# Patient Record
Sex: Female | Born: 1950
Health system: Southern US, Community
[De-identification: ages and names within clinical notes are randomized; demographics above are authoritative.]

## PROBLEM LIST (undated history)

## (undated) DIAGNOSIS — Z973 Presence of spectacles and contact lenses: Secondary | ICD-10-CM

## (undated) DIAGNOSIS — E785 Hyperlipidemia, unspecified: Secondary | ICD-10-CM

## (undated) DIAGNOSIS — Z780 Asymptomatic menopausal state: Secondary | ICD-10-CM

## (undated) HISTORY — DX: Asymptomatic menopausal state: Z78.0

## (undated) HISTORY — DX: Hyperlipidemia, unspecified: E78.5

---

## 2005-03-11 ENCOUNTER — Ambulatory Visit: Payer: Self-pay | Admitting: Unknown Physician Specialty

## 2005-03-21 ENCOUNTER — Ambulatory Visit: Payer: Self-pay | Admitting: Unknown Physician Specialty

## 2005-06-16 HISTORY — PX: DILATION AND CURETTAGE, DIAGNOSTIC / THERAPEUTIC: SUR384

## 2006-08-25 ENCOUNTER — Ambulatory Visit: Payer: Self-pay | Admitting: Unknown Physician Specialty

## 2007-01-11 ENCOUNTER — Ambulatory Visit: Payer: Self-pay | Admitting: Unknown Physician Specialty

## 2007-01-21 ENCOUNTER — Ambulatory Visit: Payer: Self-pay | Admitting: Unknown Physician Specialty

## 2007-02-17 ENCOUNTER — Ambulatory Visit: Payer: Self-pay | Admitting: Unknown Physician Specialty

## 2007-12-21 ENCOUNTER — Ambulatory Visit: Payer: Self-pay | Admitting: Chiropractic Medicine

## 2007-12-21 ENCOUNTER — Ambulatory Visit: Payer: Self-pay | Admitting: Unknown Physician Specialty

## 2008-06-07 ENCOUNTER — Ambulatory Visit: Payer: Self-pay | Admitting: Unknown Physician Specialty

## 2009-01-22 ENCOUNTER — Ambulatory Visit: Payer: Self-pay | Admitting: Unknown Physician Specialty

## 2010-01-04 LAB — HM PAP SMEAR: HM Pap smear: NORMAL

## 2010-02-11 ENCOUNTER — Ambulatory Visit: Payer: Self-pay | Admitting: Unknown Physician Specialty

## 2011-01-05 LAB — HM MAMMOGRAPHY: HM Mammogram: NORMAL

## 2011-02-18 ENCOUNTER — Ambulatory Visit: Payer: Self-pay | Admitting: Unknown Physician Specialty

## 2011-03-08 LAB — HM MAMMOGRAPHY: HM Mammogram: NORMAL

## 2011-07-08 ENCOUNTER — Encounter: Payer: Self-pay | Admitting: Internal Medicine

## 2011-07-08 ENCOUNTER — Ambulatory Visit (INDEPENDENT_AMBULATORY_CARE_PROVIDER_SITE_OTHER): Payer: Self-pay | Admitting: Internal Medicine

## 2011-07-08 DIAGNOSIS — Z780 Asymptomatic menopausal state: Secondary | ICD-10-CM | POA: Insufficient documentation

## 2011-07-08 DIAGNOSIS — N951 Menopausal and female climacteric states: Secondary | ICD-10-CM

## 2011-07-08 DIAGNOSIS — M899 Disorder of bone, unspecified: Secondary | ICD-10-CM

## 2011-07-08 DIAGNOSIS — M8580 Other specified disorders of bone density and structure, unspecified site: Secondary | ICD-10-CM | POA: Insufficient documentation

## 2011-07-08 DIAGNOSIS — E785 Hyperlipidemia, unspecified: Secondary | ICD-10-CM | POA: Insufficient documentation

## 2011-07-08 DIAGNOSIS — Z79899 Other long term (current) drug therapy: Secondary | ICD-10-CM

## 2011-07-08 LAB — COMPREHENSIVE METABOLIC PANEL
AST: 18 U/L (ref 0–37)
BUN: 22 mg/dL (ref 6–23)
Calcium: 8.9 mg/dL (ref 8.4–10.5)
Chloride: 108 mEq/L (ref 96–112)
Creatinine, Ser: 0.8 mg/dL (ref 0.4–1.2)
GFR: 83.65 mL/min (ref >60.00)
Total Bilirubin: 0.6 mg/dL (ref 0.3–1.2)

## 2011-07-08 LAB — LIPID PANEL
Cholesterol: 153 mg/dL (ref 0–200)
HDL: 62.7 mg/dL (ref >39.00)
LDL Cholesterol: 74 mg/dL (ref 0–99)
Total CHOL/HDL Ratio: 2
Triglycerides: 81 mg/dL (ref 0.0–149.0)

## 2011-07-08 NOTE — Patient Instructions (Signed)
If you want to taper your prempro,  Take one tablet every other day for one week,  Then every 2 days for one week then stop .

## 2011-07-08 NOTE — Assessment & Plan Note (Addendum)
She was treated with alendronate for 5 years for osteopenia, bt it was stopped 2 years ago.  Last DEXA was 16109 at [facility]. I will review her prior DEXA scan and compared to the one that we will obtain this year. If there has been significant deterioration in T-scores without treatment we'll do this year we will discuss alternative treatments other than other than bisphosphonates, including Evista earlier.

## 2011-07-08 NOTE — Progress Notes (Signed)
Subjective:    Patient ID: Ashley Ward, female    DOB: 1950-10-28, 61 y.o.   MRN: [id number]  HPI Ashley Ward is a 61 year old white female with a history of hypertension who is here to transfer care from Dr. Francia Greaves. She feels well today is recovering from a viral infection which has lasted several days but did not involve fevers or myalgias. She has no sore throat or productive cough at this time. She did receive the influenza vaccine back in September. She has been taking cholesterol medication for the last year and he is due for repeat check along with liver function tests. She is fasting today.    Past Medical History  Diagnosis Date  . Menopause   . Hyperlipidemia   . Menopause    No current outpatient prescriptions on file prior to visit.    Review of Systems  Constitutional: Negative for fever, chills and unexpected weight change.  HENT: Positive for congestion, rhinorrhea and postnasal drip. Negative for hearing loss, ear pain, nosebleeds, sore throat, facial swelling, sneezing, mouth sores, trouble swallowing, neck pain, neck stiffness, voice change, sinus pressure, tinnitus and ear discharge.   Eyes: Negative for pain, discharge, redness and visual disturbance.  Respiratory: Negative for cough, chest tightness, shortness of breath, wheezing and stridor.   Cardiovascular: Negative for chest pain, palpitations and leg swelling.  Musculoskeletal: Negative for myalgias and arthralgias.  Skin: Negative for color change and rash.  Neurological: Negative for dizziness, weakness, light-headedness and headaches.  Hematological: Negative for adenopathy.       Objective:   Physical Exam  Constitutional: She is oriented to person, place, and time. She appears well-developed and well-nourished.  HENT:  Mouth/Throat: Oropharynx is clear and moist.  Eyes: EOM are normal. Pupils are equal, round, and reactive to light. No scleral icterus.  Neck: Normal range of motion. Neck  supple. No JVD present. No thyromegaly present.  Cardiovascular: Normal rate, regular rhythm, normal heart sounds and intact distal pulses.   Pulmonary/Chest: Effort normal and breath sounds normal.  Abdominal: Soft. Bowel sounds are normal. She exhibits no mass. There is no tenderness.  Musculoskeletal: Normal range of motion. She exhibits no edema.  Lymphadenopathy:    She has no cervical adenopathy.  Neurological: She is alert and oriented to person, place, and time.  Skin: Skin is warm and dry.  Psychiatric: She has a normal mood and affect.      Assessment & Plan:   Other and unspecified hyperlipidemia She has been taking statin for several years for management of hyperlipidemia. She has not had a fasting lipid panel or liver function tests in 6 months and is therefore due. She has lost 20 pounds using dietary restriction via Weight Watchers and exercise and is hopeful she will be able to stop statin.   Osteopenia She was treated with alendronate for 5 years for osteopenia, bt it was stopped 2 years ago.  Last DEXA was 40981 at [facility]. I will review her prior DEXA scan and compared to the one that we will obtain this year. If there has been significant deterioration in T-scores without treatment we'll do this year we will discuss alternative treatments other than other than bisphosphonates, including Evista earlier.   Menopause She has been taking Prempro low dose daily for several years for management of menopausal symptoms. She is interested in discontinuing this medication if not needed to control her symptoms. Suggested tapering her dose by taking 1 tablet every other day for a week followed  by every other day for 2 weeks. Recommended that she maintain her dose at whatever level controls her hot flashes if they are severe. She will need a Pap smear at next visit    Updated Medication List Outpatient Encounter Prescriptions as of 07/08/2011  Medication Sig Dispense Refill  .  estrogen, conjugated,-medroxyprogesterone (PREMPRO) 0.3-1.5 MG per tablet Take 1 tablet by mouth daily.      . rosuvastatin (CRESTOR) 5 MG tablet Take 5 mg by mouth daily.

## 2011-07-08 NOTE — Assessment & Plan Note (Addendum)
She has been taking statin for several years for management of hyperlipidemia. She has not had a fasting lipid panel or liver function tests in 6 months and is therefore due. She has lost 20 pounds using dietary restriction via Weight Watchers and exercise and is hopeful she will be able to stop statin.

## 2011-07-09 ENCOUNTER — Encounter: Payer: Self-pay | Admitting: Internal Medicine

## 2011-07-09 NOTE — Assessment & Plan Note (Signed)
She has been taking Prempro low dose daily for several years for management of menopausal symptoms. She is interested in discontinuing this medication if not needed to control her symptoms. Suggested tapering her dose by taking 1 tablet every other day for a week followed by every other day for 2 weeks. Recommended that she maintain her dose at whatever level controls her hot flashes if they are severe. She will need a Pap smear at next visit

## 2011-09-05 ENCOUNTER — Other Ambulatory Visit: Payer: Self-pay | Admitting: Internal Medicine

## 2011-09-05 MED ORDER — ROSUVASTATIN CALCIUM 5 MG PO TABS: 5.0000 mg | ORAL_TABLET | Freq: Every day | ORAL | Status: DC

## 2012-01-02 ENCOUNTER — Other Ambulatory Visit: Payer: Self-pay | Admitting: *Deleted

## 2012-01-02 MED ORDER — CONJ ESTROG-MEDROXYPROGEST ACE 0.3-1.5 MG PO TABS: 1.0000 | ORAL_TABLET | Freq: Every day | ORAL | Status: DC

## 2012-01-05 ENCOUNTER — Encounter: Payer: Self-pay | Admitting: Internal Medicine

## 2012-01-05 ENCOUNTER — Ambulatory Visit (INDEPENDENT_AMBULATORY_CARE_PROVIDER_SITE_OTHER): Payer: 59 | Admitting: Internal Medicine

## 2012-01-05 VITALS — BP 110/66 | HR 74 | Temp 98.3°F | Resp 16 | Wt 154.8 lb

## 2012-01-05 DIAGNOSIS — Z6825 Body mass index (BMI) 25.0-25.9, adult: Secondary | ICD-10-CM

## 2012-01-05 DIAGNOSIS — Z780 Asymptomatic menopausal state: Secondary | ICD-10-CM

## 2012-01-05 DIAGNOSIS — Z1239 Encounter for other screening for malignant neoplasm of breast: Secondary | ICD-10-CM

## 2012-01-05 DIAGNOSIS — E785 Hyperlipidemia, unspecified: Secondary | ICD-10-CM

## 2012-01-05 DIAGNOSIS — Z79899 Other long term (current) drug therapy: Secondary | ICD-10-CM

## 2012-01-05 DIAGNOSIS — E663 Overweight: Secondary | ICD-10-CM | POA: Insufficient documentation

## 2012-01-05 LAB — COMPREHENSIVE METABOLIC PANEL
ALT: 17 U/L (ref 0–35)
AST: 20 U/L (ref 0–37)
Albumin: 4.1 g/dL (ref 3.5–5.2)
Alkaline Phosphatase: 40 U/L (ref 39–117)
Glucose, Bld: 110 mg/dL — ABNORMAL HIGH (ref 70–99)
Potassium: 4.1 mEq/L (ref 3.5–5.1)
Sodium: 141 mEq/L (ref 135–145)
Total Protein: 6.6 g/dL (ref 6.0–8.3)

## 2012-01-05 LAB — LIPID PANEL
HDL: 69.2 mg/dL (ref >39.00)
Total CHOL/HDL Ratio: 2
Triglycerides: 78 mg/dL (ref 0.0–149.0)
VLDL: 15.6 mg/dL (ref 0.0–40.0)

## 2012-01-05 NOTE — Assessment & Plan Note (Signed)
Now resolved with a 32 pound weight loss total. BMI is now under 25. She presses an additional 8 pounds and to site lifting weights to augment her aerobic belt use.

## 2012-01-05 NOTE — Progress Notes (Signed)
Patient ID: Ashley Ward, female   DOB: 1950-10-14, 61 y.o.   MRN: [id number]  Patient Active Problem List  Diagnosis  . Other and unspecified hyperlipidemia  . Osteopenia  . Menopause    Subjective:  CC:   Chief Complaint  Patient presents with  . Follow-up    HPI:   Ashley Ward a 61 y.o. female who presents 4 six-month followup on hyperlipidemia and overweight. To date she has  lost 34 lbs through weight watchers.  Her personal goal  is 8. 5 lbs more .  She is exercising daily by using a treadmill for 45 minutes per day. She would like to be able to stop using Crestor for cholesterol elevation as well as hormone replacement therapy..   Last LDL is 70 .  She denies myalgias muscle weakness chest pain shortness of breath and uncontrollable hot flashes. No mood swings.   Past Medical History  Diagnosis Date  . Menopause   . Hyperlipidemia   . Menopause     Past Surgical History  Procedure Date  . Dilation and curettage, diagnostic / therapeutic 2007    Kincius,  for spotting, fresolved   The following portions of the patient's history were reviewed and updated as appropriate: Allergies, current medications, and problem list.    Review of Systems:   Review of Systems  Constitutional: Negative.   HENT: Negative.   Eyes: Negative.   Respiratory: Negative.   Cardiovascular: Negative.   Gastrointestinal: Negative.   Genitourinary: Negative.   Musculoskeletal: Negative.   Skin: Negative.   Neurological: Negative.   Endo/Heme/Allergies: Negative.   Psychiatric/Behavioral: Negative.       History   Social History  . Marital Status: Married    Spouse Name: N/A    Number of Children: N/A  . Years of Education: N/A   Occupational History  . Not on file.   Social History Main Topics  . Smoking status: Never Smoker   . Smokeless tobacco: Never Used  . Alcohol Use: 2.4 oz/week    4 Glasses of wine per week  . Drug Use: No  . Sexually Active: Not on file     Other Topics Concern  . Not on file   Social History Narrative  . No narrative on file    Objective:  BP 110/66  Pulse 74  Temp 98.3 F (36.8 C) (Oral)  Resp 16  Wt 154 lb 12 oz (70.194 kg)  SpO2 96%  General appearance: alert, cooperative and appears stated age Ears: normal TM's and external ear canals both ears Throat: lips, mucosa, and tongue normal; teeth and gums normal Neck: no adenopathy, no carotid bruit, supple, symmetrical, trachea midline and thyroid not enlarged, symmetric, no tenderness/mass/nodules Back: symmetric, no curvature. ROM normal. No CVA tenderness. Lungs: clear to auscultation bilaterally Heart: regular rate and rhythm, S1, S2 normal, no murmur, click, rub or gallop Abdomen: soft, non-tender; bowel sounds normal; no masses,  no organomegaly Pulses: 2+ and symmetric Skin: Skin color, texture, turgor normal. No rashes or lesions Lymph nodes: Cervical, supraclavicular, and axillary nodes normal.  Assessment and Plan:  Other and unspecified hyperlipidemia Managed with statin therapy for years. However given her continued weight loss, there is a good chance she may no longer need statin therapy to control her cholesterol.  at last visit her LDL was 70 and her goal is really 100 to 130 LDL. She has lost another 10 or 12 pounds. We will repeat her lipids today and if her LDL is  less than 100 we will stop Crestor.  Overweight (BMI 25.0-29.9) Now resolved with a 32 pound weight loss total. BMI is now under 25. She presses an additional 8 pounds and to site lifting weights to augment her aerobic belt use.  Menopause We discussed tapering her off of her hormone replacement therapy starting this winter. She was on hormone replacement therapy prior to diagnosis of hyperlipidemia.   Updated Medication List Outpatient Encounter Prescriptions as of 01/05/2012  Medication Sig Dispense Refill  . cholecalciferol (VITAMIN D) 1000 UNITS tablet Take 1,000 Units by  mouth daily.      Marland Kitchen estrogen, conjugated,-medroxyprogesterone (PREMPRO) 0.3-1.5 MG per tablet Take 1 tablet by mouth daily.  30 tablet  2  . Multiple Vitamin (MULTIVITAMIN) tablet Take 1 tablet by mouth daily.      Marland Kitchen DISCONTD: rosuvastatin (CRESTOR) 5 MG tablet Take 1 tablet (5 mg total) by mouth daily.  30 tablet  3

## 2012-01-05 NOTE — Assessment & Plan Note (Signed)
We discussed tapering her off of her hormone replacement therapy starting this winter. She was on hormone replacement therapy prior to diagnosis of hyperlipidemia.

## 2012-01-05 NOTE — Assessment & Plan Note (Signed)
Managed with statin therapy for years. However given her continued weight loss, there is a good chance she may no longer need statin therapy to control her cholesterol.  at last visit her LDL was 70 and her goal is really 100 to 130 LDL. She has lost another 10 or 12 pounds. We will repeat her lipids today and if her LDL is less than 100 we will stop Crestor.

## 2012-01-05 NOTE — Patient Instructions (Addendum)
You may discontinue your crestor unless you hear otherwise from me.  Return in a few months for your annual physical  Mammogram to be set up

## 2012-02-19 ENCOUNTER — Ambulatory Visit: Payer: Self-pay | Admitting: Internal Medicine

## 2012-02-19 LAB — HM MAMMOGRAPHY: HM Mammogram: ABNORMAL

## 2012-02-23 ENCOUNTER — Telehealth: Payer: Self-pay | Admitting: Internal Medicine

## 2012-02-23 DIAGNOSIS — R928 Other abnormal and inconclusive findings on diagnostic imaging of breast: Secondary | ICD-10-CM

## 2012-02-23 NOTE — Telephone Encounter (Signed)
Patient notified.  She stated she has not been contacted from [facility] yet.

## 2012-02-23 NOTE — Telephone Encounter (Signed)
The mammogram she had last week was inconclusive on the right side and she will need additional imaging .  Has [facility] contacted her?

## 2012-02-24 NOTE — Telephone Encounter (Signed)
Patient notified, order sent to [facility] and appt made for Friday with Dr. Darrick Huntsman.

## 2012-02-24 NOTE — Telephone Encounter (Signed)
I have printed the order for the diagnostic mammogram on the right,  Please send to [facility].  Please let patient know that I would like to see her after the follow up mammogram for a quick 5 min visit to check the breast.

## 2012-02-25 ENCOUNTER — Ambulatory Visit: Payer: Self-pay | Admitting: Internal Medicine

## 2012-02-25 LAB — HM MAMMOGRAPHY: HM Mammogram: NORMAL

## 2012-02-27 ENCOUNTER — Ambulatory Visit (INDEPENDENT_AMBULATORY_CARE_PROVIDER_SITE_OTHER): Payer: 59 | Admitting: Internal Medicine

## 2012-02-27 VITALS — BP 100/60 | HR 58 | Temp 98.2°F | Wt 154.0 lb

## 2012-02-27 DIAGNOSIS — R928 Other abnormal and inconclusive findings on diagnostic imaging of breast: Secondary | ICD-10-CM

## 2012-02-27 DIAGNOSIS — N6459 Other signs and symptoms in breast: Secondary | ICD-10-CM

## 2012-02-29 ENCOUNTER — Encounter: Payer: Self-pay | Admitting: Internal Medicine

## 2012-02-29 DIAGNOSIS — R928 Other abnormal and inconclusive findings on diagnostic imaging of breast: Secondary | ICD-10-CM | POA: Insufficient documentation

## 2012-02-29 NOTE — Progress Notes (Signed)
Patient ID: Ashley Ward, female   DOB: [birth year], 61 y.o.   MRN: [id number]  Patient Active Problem List  Diagnosis  . Other and unspecified hyperlipidemia  . Osteopenia  . Menopause  . Overweight (BMI 25.0-29.9)  . Abnormal finding on mammography    Subjective:  CC:   Chief Complaint  Patient presents with  . Breast Problem    HPI:   Ashley Ward a 61 y.o. female who presents at my request for breast exam following an abnormal mammogram which occurred recently and was followed with ultrasound of the right breast. The diagnostic mammogram and ultrasound were normal. She does  regular breast exams on herself, but has been checking her breasts since  these were  Done in is not aware of any lumps or masses. She has a family history of breast cancer.   Past Medical History  Diagnosis Date  . Menopause   . Hyperlipidemia   . Menopause     Past Surgical History  Procedure Date  . Dilation and curettage, diagnostic / therapeutic 2007    Kincius,  for spotting, fresolved   The following portions of the patient's history were reviewed and updated as appropriate: Allergies, current medications, and problem list.    Review of Systems:   12 Pt  review of systems was negative except those addressed in the HPI,     History   Social History  . Marital Status: Married    Spouse Name: N/A    Number of Children: N/A  . Years of Education: N/A   Occupational History  . Not on file.   Social History Main Topics  . Smoking status: Never Smoker   . Smokeless tobacco: Never Used  . Alcohol Use: 2.4 oz/week    4 Glasses of wine per week  . Drug Use: No  . Sexually Active: Not on file   Other Topics Concern  . Not on file   Social History Narrative  . No narrative on file   Family History  Problem Relation Age of Onset  . Diabetes Mother   . Cancer Mother     lymphoma., in remission  . COPD Father   . Cancer Sister     half sister , negative genetic testing    . Alcohol abuse Sister     Objective:  BP 100/60  Pulse 58  Temp 98.2 F (36.8 C) (Oral)  Wt 154 lb (69.854 kg)  General appearance: alert, cooperative and appears stated age Neck: no adenopathy, no carotid bruit, supple, symmetrical, trachea midline and thyroid not enlarged, symmetric, no tenderness/mass/nodules Heart: regular rate and rhythm, S1, S2 normal, no murmur, click, rub or gallop Breast; pea sized nodule right breast , nontender Abdomen: soft, non-tender; bowel sounds normal; no masses,  no organomegaly Pulses: 2+ and symmetric Skin: Skin color, texture, turgor normal. No rashes or lesions Lymph nodes: Cervical, supraclavicular, and axillary nodes normal.  Assessment and Plan:  Abnormal finding on mammography She is being sent for a second opinion by the [facility]. I did feel a small mass on exam and have noted this on the order form.   Updated Medication List Outpatient Encounter Prescriptions as of 02/27/2012  Medication Sig Dispense Refill  . cholecalciferol (VITAMIN D) 1000 UNITS tablet Take 1,000 Units by mouth daily.      Marland Kitchen estrogen, conjugated,-medroxyprogesterone (PREMPRO) 0.3-1.5 MG per tablet Take 1 tablet by mouth daily.  30 tablet  2  . Multiple Vitamin (MULTIVITAMIN) tablet Take 1 tablet by  mouth daily.         Orders Placed This Encounter  Procedures  . MM Digital Diagnostic Unilat R    No Follow-up on file.

## 2012-02-29 NOTE — Assessment & Plan Note (Signed)
She is being sent for a second opinion by the [facility]. I did feel a small mass on exam and have noted this on the order form.

## 2012-03-07 ENCOUNTER — Encounter: Payer: Self-pay | Admitting: Internal Medicine

## 2012-03-07 DIAGNOSIS — R928 Other abnormal and inconclusive findings on diagnostic imaging of breast: Secondary | ICD-10-CM

## 2012-03-07 NOTE — Assessment & Plan Note (Signed)
Repeat mammogram of the right breast was normal at [facility] center.

## 2012-03-15 ENCOUNTER — Encounter: Payer: Self-pay | Admitting: Internal Medicine

## 2012-03-24 ENCOUNTER — Encounter: Payer: Self-pay | Admitting: Internal Medicine

## 2012-04-09 ENCOUNTER — Telehealth: Payer: Self-pay | Admitting: Internal Medicine

## 2012-04-09 MED ORDER — CONJ ESTROG-MEDROXYPROGEST ACE 0.3-1.5 MG PO TABS: 1.0000 | ORAL_TABLET | Freq: Every day | ORAL | Status: DC

## 2012-04-09 NOTE — Telephone Encounter (Signed)
Refill request for prempro 0.3-1.5 mg tab #28 Sig: use as per package instructions take 1 tablet every day

## 2012-04-09 NOTE — Telephone Encounter (Signed)
Refill done via epic

## 2012-04-26 ENCOUNTER — Other Ambulatory Visit (HOSPITAL_COMMUNITY)
Admission: RE | Admit: 2012-04-26 | Discharge: 2012-04-26 | Disposition: A | Discharge status: Home / Self Care | Payer: 59 | Source: Ambulatory Visit | Attending: Internal Medicine | Admitting: Internal Medicine

## 2012-04-26 ENCOUNTER — Ambulatory Visit (INDEPENDENT_AMBULATORY_CARE_PROVIDER_SITE_OTHER): Payer: 59 | Admitting: Internal Medicine

## 2012-04-26 ENCOUNTER — Encounter: Payer: Self-pay | Admitting: Internal Medicine

## 2012-04-26 VITALS — BP 98/60 | HR 71 | Temp 97.7°F | Resp 12 | Ht 66.5 in | Wt 151.5 lb

## 2012-04-26 DIAGNOSIS — Z23 Encounter for immunization: Secondary | ICD-10-CM

## 2012-04-26 DIAGNOSIS — Z01419 Encounter for gynecological examination (general) (routine) without abnormal findings: Secondary | ICD-10-CM | POA: Insufficient documentation

## 2012-04-26 DIAGNOSIS — R928 Other abnormal and inconclusive findings on diagnostic imaging of breast: Secondary | ICD-10-CM

## 2012-04-26 DIAGNOSIS — R739 Hyperglycemia, unspecified: Secondary | ICD-10-CM

## 2012-04-26 DIAGNOSIS — Z1151 Encounter for screening for human papillomavirus (HPV): Secondary | ICD-10-CM | POA: Insufficient documentation

## 2012-04-26 DIAGNOSIS — Z1211 Encounter for screening for malignant neoplasm of colon: Secondary | ICD-10-CM | POA: Insufficient documentation

## 2012-04-26 DIAGNOSIS — Z6825 Body mass index (BMI) 25.0-25.9, adult: Secondary | ICD-10-CM

## 2012-04-26 DIAGNOSIS — E663 Overweight: Secondary | ICD-10-CM

## 2012-04-26 DIAGNOSIS — R7309 Other abnormal glucose: Secondary | ICD-10-CM

## 2012-04-26 DIAGNOSIS — Z124 Encounter for screening for malignant neoplasm of cervix: Secondary | ICD-10-CM

## 2012-04-26 LAB — BASIC METABOLIC PANEL
GFR: 88.87 mL/min (ref >60.00)
Potassium: 4.4 mEq/L (ref 3.5–5.1)
Sodium: 141 mEq/L (ref 135–145)

## 2012-04-26 NOTE — Progress Notes (Signed)
Patient ID: Ashley Ward, female   DOB: 1950-08-05, 61 y.o.   MRN: [id number]  Subjective:     Ashley Ward is a 61 y.o. female and is here for a comprehensive physical exam. The patient reports here for annual exam with breast and PAP.  Had second opinion on films done at [facility] in Sept at [facility]  opined that there were no suspicious densities.  New cc right foot sensation of pain on the ball of foot.  Goes numb,  And cramps occasionally between toes 2 and 3,  And 3 and 4.  Not daily.  Not aggravated by shoes.  Has been walking more for exercise. 30 to 60 minutes daily,  Shoes are over one year old .   History   Social History  . Marital Status: Married    Spouse Name: N/A    Number of Children: N/A  . Years of Education: N/A   Occupational History  . Not on file.   Social History Main Topics  . Smoking status: Never Smoker   . Smokeless tobacco: Never Used  . Alcohol Use: 2.4 oz/week    4 Glasses of wine per week  . Drug Use: No  . Sexually Active: Not on file   Other Topics Concern  . Not on file   Social History Narrative  . No narrative on file   Health Maintenance  Topic Date Due  . Tetanus/tdap  01/24/1970  . Zostavax  01/25/2011  . Colonoscopy  01/05/2012  . Influenza Vaccine  02/15/2012  . Pap Smear  01/04/2013  . Mammogram  03/02/2014    The following portions of the patient's history were reviewed and updated as appropriate: allergies, current medications, past family history, past medical history, past social history, past surgical history and problem list.  Review of Systems A comprehensive review of systems was negative except for: Neurological: positive for paresthesia    Objective:       Healthy female exam. BP 98/60  Pulse 71  Temp 97.7 F (36.5 C) (Oral)  Resp 12  Ht 5' 6.5" (1.689 m)  Wt 151 lb 8 oz (68.72 kg)  BMI 24.09 kg/m2  SpO2 97%  General Appearance:    Alert, cooperative, no distress, appears stated age  Head:     Normocephalic, without obvious abnormality, atraumatic  Eyes:    PERRL, conjunctiva/corneas clear, EOM's intact, fundi    benign, both eyes  Ears:    Normal TM's and external ear canals, both ears  Nose:   Nares normal, septum midline, mucosa normal, no drainage    or sinus tenderness  Throat:   Lips, mucosa, and tongue normal; teeth and gums normal  Neck:   Supple, symmetrical, trachea midline, no adenopathy;    thyroid:  no enlargement/tenderness/nodules; no carotid   bruit or JVD  Back:     Symmetric, no curvature, ROM normal, no CVA tenderness  Lungs:     Clear to auscultation bilaterally, respirations unlabored  Chest Wall:    No tenderness or deformity   Heart:    Regular rate and rhythm, S1 and S2 normal, no murmur, rub   or gallop  Breast Exam:    No tenderness, masses, or nipple abnormality  Abdomen:     Soft, non-tender, bowel sounds active all four quadrants,    no masses, no organomegaly  Genitalia:    Normal female without lesion, discharge or tenderness     Extremities:   Extremities normal, atraumatic, no cyanosis or  edema  Pulses:   2+ and symmetric all extremities  Skin:   Skin color, texture, turgor normal, no rashes or lesions  Lymph nodes:   Cervical, supraclavicular, and axillary nodes normal  Neurologic:   CNII-XII intact, normal strength, sensation and reflexes    throughout      Assessment and Plan  Overweight (BMI 25.0-29.9) Resolved wit 15 lb wt loss.  BMI now < 25.    Abnormal finding on mammography Repeat eval by [facility] normal. Sept 2013  Screening for colon cancer Normal colonoscopy [place] 2003. Repeat due and referral in place.    Updated Medication List Outpatient Encounter Prescriptions as of 04/26/2012  Medication Sig Dispense Refill  . cholecalciferol (VITAMIN D) 1000 UNITS tablet Take 1,000 Units by mouth daily.      Marland Kitchen estrogen, conjugated,-medroxyprogesterone (PREMPRO) 0.3-1.5 MG per tablet Take 1 tablet by mouth daily.  30 tablet  2  .  Multiple Vitamin (MULTIVITAMIN) tablet Take 1 tablet by mouth daily.

## 2012-04-26 NOTE — Patient Instructions (Addendum)
We will schedule your colonoscopy after the New Year with Dr Mechele Collin

## 2012-04-26 NOTE — Assessment & Plan Note (Signed)
Resolved wit 15 lb wt loss.  BMI now < 25.

## 2012-04-26 NOTE — Assessment & Plan Note (Addendum)
Normal colonoscopy [place] 2003. Repeat due and referral in place.

## 2012-04-26 NOTE — Assessment & Plan Note (Signed)
Repeat eval by [facility] normal. Sept 2013

## 2012-05-27 LAB — HM PAP SMEAR: HM Pap smear: NORMAL

## 2012-07-14 ENCOUNTER — Ambulatory Visit (INDEPENDENT_AMBULATORY_CARE_PROVIDER_SITE_OTHER): Payer: 59 | Admitting: Internal Medicine

## 2012-07-14 ENCOUNTER — Encounter: Payer: Self-pay | Admitting: Internal Medicine

## 2012-07-14 ENCOUNTER — Telehealth: Payer: Self-pay | Admitting: Internal Medicine

## 2012-07-14 VITALS — BP 120/78 | HR 85 | Temp 97.8°F | Resp 16 | Wt 153.2 lb

## 2012-07-14 DIAGNOSIS — E663 Overweight: Secondary | ICD-10-CM

## 2012-07-14 DIAGNOSIS — R350 Frequency of micturition: Secondary | ICD-10-CM

## 2012-07-14 DIAGNOSIS — Z6825 Body mass index (BMI) 25.0-25.9, adult: Secondary | ICD-10-CM

## 2012-07-14 DIAGNOSIS — N390 Urinary tract infection, site not specified: Secondary | ICD-10-CM

## 2012-07-14 LAB — POCT URINALYSIS DIPSTICK
Bilirubin, UA: NEGATIVE
Glucose, UA: NEGATIVE
Nitrite, UA: NEGATIVE
Urobilinogen, UA: 0.2

## 2012-07-14 MED ORDER — SULFAMETHOXAZOLE-TRIMETHOPRIM 800-160 MG PO TABS: 1.0000 | ORAL_TABLET | Freq: Two times a day (BID) | ORAL | Status: DC

## 2012-07-14 NOTE — Patient Instructions (Addendum)
You have a urinary tract infection.   Septra DS one tablet twice daily for 5 days

## 2012-07-14 NOTE — Telephone Encounter (Signed)
Patient Information:  Caller Name: [place]  Phone: [phone]  Patient: Ashley Ward, Ashley Ward  Gender: Female  DOB: 1951-01-14  Age: 62 Years  PCP: Duncan Dull (Adults only)  Office Follow Up:  Does the office need to follow up with this patient?: No  Instructions For The Office: N/A   Symptoms  Reason For Call & Symptoms: Frequent urination, fullness in lower abdomen, burning with urination  Reviewed Health History In EMR: Yes  Reviewed Medications In EMR: Yes  Reviewed Allergies In EMR: Yes  Reviewed Surgeries / Procedures: Yes  Date of Onset of Symptoms: 07/13/2012  Treatments Tried: Cystex -  Treatments Tried Worked: No  Guideline(s) Used:  Urination Pain - Female  Disposition Per Guideline:   See Today in Office  Reason For Disposition Reached:   Age > 50 years  Advice Given:  Fluids:   Drink extra fluids. Drink 8-10 glasses of liquids a day (Reason: to produce a dilute, non-irritating urine).  Cranberry Juice:   Some people think that drinking cranberry juice may help in fighting urinary tract infections. However, there is no good research that has ever proved this.  Caution: Do not drink more than 16 oz (480 ml). Here is the reason: too much cranberry juice can also be irritating to the bladder.  Warm Saline SITZ Baths to Reduce Pain:  Sit in a warm saline bath for 20 minutes to cleanse the area and to reduce pain. Add 2 oz. of table salt or baking soda to a tub of water.  Call Back If:  You become worse.  Appointment Scheduled:  07/14/2012 15:30:00 Appointment Scheduled Provider:  Duncan Dull (Adults only)

## 2012-07-14 NOTE — Progress Notes (Signed)
Patient ID: Ashley Ward, female   DOB: 07-30-1950, 62 y.o.   MRN: [id number]  Patient Active Problem List  Diagnosis  . Other and unspecified hyperlipidemia  . Osteopenia  . Menopause  . Overweight (BMI 25.0-29.9)  . Screening for colon cancer  . UTI (lower urinary tract infection)    Subjective:  CC:   Chief Complaint  Patient presents with  . Urinary Tract Infection    HPI:   Ashley Ward a 62 y.o. female who presents Urinary symptoms on and off for the past 2 days,  Worse this morning,.  only change is she has been reducing her oral estrogen hormone tablet for the last month as art of a  slow wean    Past Medical History  Diagnosis Date  . Menopause   . Hyperlipidemia   . Menopause     Past Surgical History  Procedure Date  . Dilation and curettage, diagnostic / therapeutic 2007    Ashley Ward,  for spotting, fresolved      The following portions of the patient's history were reviewed and updated as appropriate: Allergies, current medications, and problem list.    Review of Systems:   12 Pt  review of systems was negative except those addressed in the HPI,     History   Social History  . Marital Status: Married    Spouse Name: N/A    Number of Children: N/A  . Years of Education: N/A   Occupational History  . Not on file.   Social History Main Topics  . Smoking status: Never Smoker   . Smokeless tobacco: Never Used  . Alcohol Use: 2.4 oz/week    4 Glasses of wine per week  . Drug Use: No  . Sexually Active: Not on file   Other Topics Concern  . Not on file   Social History Narrative  . No narrative on file    Objective:  BP 120/78  Pulse 85  Temp 97.8 F (36.6 C) (Oral)  Resp 16  Wt 153 lb 4 oz (69.514 kg)  SpO2 98%  General appearance: alert, cooperative and appears stated age Ears: normal TM's and external ear canals both ears Throat: lips, mucosa, and tongue normal; teeth and gums normal Neck: no adenopathy, no carotid  bruit, supple, symmetrical, trachea midline and thyroid not enlarged, symmetric, no tenderness/mass/nodules Back: symmetric, no curvature. ROM normal. No CVA tenderness. Lungs: clear to auscultation bilaterally Heart: regular rate and rhythm, S1, S2 normal, no murmur, click, rub or gallop Abdomen: soft, non-tender; bowel sounds normal; no masses,  no organomegaly Pulses: 2+ and symmetric Skin: Skin color, texture, turgor normal. No rashes or lesions Lymph nodes: Cervical, supraclavicular, and axillary nodes normal.  Assessment and Plan:  Overweight (BMI 25.0-29.9) Empiric Septra pending urine culture review.    Updated Medication List Outpatient Encounter Prescriptions as of 07/14/2012  Medication Sig Dispense Refill  . cholecalciferol (VITAMIN D) 1000 UNITS tablet Take 1,000 Units by mouth daily.      Marland Kitchen estrogen, conjugated,-medroxyprogesterone (PREMPRO) 0.3-1.5 MG per tablet Take 1 tablet by mouth daily.  30 tablet  2  . Multiple Vitamin (MULTIVITAMIN) tablet Take 1 tablet by mouth daily.      Marland Kitchen sulfamethoxazole-trimethoprim (SEPTRA DS) 800-160 MG per tablet Take 1 tablet by mouth 2 (two) times daily.  10 tablet  0     Orders Placed This Encounter  Procedures  . Urine culture  . POCT urinalysis dipstick    No Follow-up on file.

## 2012-07-14 NOTE — Assessment & Plan Note (Signed)
Empiric Septra pending urine culture review.

## 2012-07-14 NOTE — Telephone Encounter (Signed)
Patient has an appointment today

## 2012-07-16 LAB — URINE CULTURE: Colony Count: >=100000

## 2012-07-31 ENCOUNTER — Other Ambulatory Visit: Payer: Self-pay

## 2012-08-09 ENCOUNTER — Other Ambulatory Visit: Payer: Self-pay | Admitting: *Deleted

## 2012-08-10 MED ORDER — CONJ ESTROG-MEDROXYPROGEST ACE 0.3-1.5 MG PO TABS: 1.0000 | ORAL_TABLET | Freq: Every day | ORAL | Status: DC

## 2012-08-10 NOTE — Telephone Encounter (Signed)
Med filled.  

## 2012-12-07 ENCOUNTER — Other Ambulatory Visit: Payer: Self-pay | Admitting: *Deleted

## 2012-12-07 MED ORDER — CONJ ESTROG-MEDROXYPROGEST ACE 0.3-1.5 MG PO TABS: 1.0000 | ORAL_TABLET | Freq: Every day | ORAL | Status: DC

## 2012-12-07 NOTE — Telephone Encounter (Deleted)
[id number]

## 2013-04-05 ENCOUNTER — Ambulatory Visit: Payer: Self-pay | Admitting: Internal Medicine

## 2013-04-21 ENCOUNTER — Other Ambulatory Visit: Payer: Self-pay

## 2013-04-27 ENCOUNTER — Encounter: Payer: Self-pay | Admitting: Internal Medicine

## 2013-05-11 ENCOUNTER — Encounter: Payer: Self-pay | Admitting: Internal Medicine

## 2013-05-11 ENCOUNTER — Ambulatory Visit (INDEPENDENT_AMBULATORY_CARE_PROVIDER_SITE_OTHER): Payer: BC Managed Care – PPO | Admitting: Internal Medicine

## 2013-05-11 VITALS — BP 98/62 | HR 81 | Temp 98.2°F | Resp 12 | Ht 66.0 in | Wt 162.5 lb

## 2013-05-11 DIAGNOSIS — E663 Overweight: Secondary | ICD-10-CM

## 2013-05-11 DIAGNOSIS — Z6825 Body mass index (BMI) 25.0-25.9, adult: Secondary | ICD-10-CM

## 2013-05-11 DIAGNOSIS — Z13220 Encounter for screening for lipoid disorders: Secondary | ICD-10-CM

## 2013-05-11 DIAGNOSIS — R5381 Other malaise: Secondary | ICD-10-CM

## 2013-05-11 DIAGNOSIS — Z0000 Encounter for general adult medical examination without abnormal findings: Secondary | ICD-10-CM

## 2013-05-11 DIAGNOSIS — E785 Hyperlipidemia, unspecified: Secondary | ICD-10-CM

## 2013-05-11 DIAGNOSIS — M899 Disorder of bone, unspecified: Secondary | ICD-10-CM

## 2013-05-11 DIAGNOSIS — E559 Vitamin D deficiency, unspecified: Secondary | ICD-10-CM

## 2013-05-11 DIAGNOSIS — M8580 Other specified disorders of bone density and structure, unspecified site: Secondary | ICD-10-CM

## 2013-05-11 LAB — CBC WITH DIFFERENTIAL/PLATELET
Basophils Absolute: 0 10*3/uL (ref 0.0–0.1)
Eosinophils Absolute: 0.1 10*3/uL (ref 0.0–0.7)
Eosinophils Relative: 2.2 % (ref 0.0–5.0)
HCT: 39 % (ref 36.0–46.0)
Hemoglobin: 13.3 g/dL (ref 12.0–15.0)
Lymphocytes Relative: 35.6 % (ref 12.0–46.0)
MCHC: 34 g/dL (ref 30.0–36.0)
Monocytes Absolute: 0.5 10*3/uL (ref 0.1–1.0)
Monocytes Relative: 9.6 % (ref 3.0–12.0)
Neutro Abs: 2.5 10*3/uL (ref 1.4–7.7)
Neutrophils Relative %: 52.1 % (ref 43.0–77.0)
Platelets: 241 10*3/uL (ref 150.0–400.0)
RDW: 12.2 % (ref 11.5–14.6)
WBC: 4.9 10*3/uL (ref 4.5–10.5)

## 2013-05-11 LAB — COMPREHENSIVE METABOLIC PANEL
ALT: 13 U/L (ref 0–35)
Albumin: 4 g/dL (ref 3.5–5.2)
Alkaline Phosphatase: 40 U/L (ref 39–117)
BUN: 17 mg/dL (ref 6–23)
CO2: 29 mEq/L (ref 19–32)
Calcium: 9.1 mg/dL (ref 8.4–10.5)
GFR: 88.57 mL/min (ref >60.00)
Glucose, Bld: 89 mg/dL (ref 70–99)
Potassium: 4.1 mEq/L (ref 3.5–5.1)
Sodium: 139 mEq/L (ref 135–145)

## 2013-05-11 LAB — TSH: TSH: 1.56 u[IU]/mL (ref 0.35–5.50)

## 2013-05-11 LAB — LIPID PANEL
Cholesterol: 202 mg/dL — ABNORMAL HIGH (ref 0–200)
HDL: 70.2 mg/dL (ref >39.00)
VLDL: 10.8 mg/dL (ref 0.0–40.0)

## 2013-05-11 NOTE — Progress Notes (Signed)
Pre-visit discussion using our clinic review tool. No additional management support is needed unless otherwise documented below in the visit note.  

## 2013-05-11 NOTE — Progress Notes (Signed)
Patient ID: Ashley Ward, female   DOB: [birth year], 62 y.o.   MRN: [id number]   .    Subjective:     Daneille Desilva is a 62 y.o. female and is here for a comprehensive physical exam. The patient reports no problems.  History   Social History  . Marital Status: Married    Spouse Name: N/A    Number of Children: N/A  . Years of Education: N/A   Occupational History  . Not on file.   Social History Main Topics  . Smoking status: Never Smoker   . Smokeless tobacco: Never Used  . Alcohol Use: 2.4 oz/week    4 Glasses of wine per week  . Drug Use: No  . Sexual Activity: Yes   Other Topics Concern  . Not on file   Social History Narrative  . No narrative on file   Health Maintenance  Topic Date Due  . Zostavax  01/25/2011  . Colonoscopy  01/05/2012  . Influenza Vaccine  01/14/2014  . Mammogram  04/06/2015  . Pap Smear  04/27/2015  . Tetanus/tdap  04/26/2022    The following portions of the patient's history were reviewed and updated as appropriate: allergies, current medications, past family history, past medical history, past social history, past surgical history and problem list.  Review of Systems A comprehensive review of systems was negative.   Objective:    BP 98/62  Pulse 81  Temp(Src) 98.2 F (36.8 C) (Oral)  Resp 12  Ht 5\' 6"  (1.676 m)  Wt 162 lb 8 oz (73.71 kg)  BMI 26.24 kg/m2  SpO2 98%  General Appearance:    Alert, cooperative, no distress, appears stated age  Head:    Normocephalic, without obvious abnormality, atraumatic  Eyes:    PERRL, conjunctiva/corneas clear, EOM's intact, fundi    benign, both eyes  Ears:    Normal TM's and external ear canals, both ears  Nose:   Nares normal, septum midline, mucosa normal, no drainage    or sinus tenderness  Throat:   Lips, mucosa, and tongue normal; teeth and gums normal  Neck:   Supple, symmetrical, trachea midline, no adenopathy;    thyroid:  no enlargement/tenderness/nodules; no carotid   bruit  or JVD  Back:     Symmetric, no curvature, ROM normal, no CVA tenderness  Lungs:     Clear to auscultation bilaterally, respirations unlabored  Chest Wall:    No tenderness or deformity   Heart:    Regular rate and rhythm, S1 and S2 normal, no murmur, rub   or gallop  Breast Exam:    No tenderness, masses, or nipple abnormality  Abdomen:     Soft, non-tender, bowel sounds active all four quadrants,    no masses, no organomegaly  Genitalia:    Deferred  Rectal:    Deferred  Extremities:   Extremities normal, atraumatic, no cyanosis or edema  Pulses:   2+ and symmetric all extremities  Skin:   Skin color, texture, turgor normal, no rashes or lesions  Lymph nodes:   Cervical, supraclavicular, and axillary nodes normal  Neurologic:   CNII-XII intact, normal strength, sensation and reflexes    throughout   Assessment and Plan:  Routine general medical examination at a health care facility Annual comprehensive exam was done including breast, excluding  pelvic and PAP smear. All screenings have been addressed .   Overweight (BMI 25.0-29.9) I have addressed  BMI and recommended wt loss of 10% of body weight  over the next 6 months using a low glycemic index diet and regular exercise a minimum of 5 days per week.    Other and unspecified hyperlipidemia Well controlled on diet alone   Osteopenia She was treated with alendronate for 5 years for osteopenia, which was stopped 2 years ago.  Last DEXA was 2011 at [facility]. Repeat DEXA ordered to be done at [facility].     Updated Medication List Outpatient Encounter Prescriptions as of 05/11/2013  Medication Sig  . cholecalciferol (VITAMIN D) 1000 UNITS tablet Take 1,000 Units by mouth daily.  Marland Kitchen estrogen, conjugated,-medroxyprogesterone (PREMPRO) 0.3-1.5 MG per tablet Take 0.5 tablets by mouth daily.  . Multiple Vitamin (MULTIVITAMIN) tablet Take 1 tablet by mouth daily.  . [DISCONTINUED] estrogen, conjugated,-medroxyprogesterone (PREMPRO)  0.3-1.5 MG per tablet Take 1 tablet by mouth daily.  . [DISCONTINUED] sulfamethoxazole-trimethoprim (SEPTRA DS) 800-160 MG per tablet Take 1 tablet by mouth 2 (two) times daily.

## 2013-05-11 NOTE — Patient Instructions (Addendum)
You had your annual  wellness exam today.    You have gained 11 lbs since last annual exam.  (Low GI diet below)  We will schedule your bone density test soon.  Please return the stool kit to Dr Mechele Collin for your colon CA screening test.    We will contact you with the bloodwork results   Check with your insurance about coverage for the following vaccines:  Prevnar 13 (pneumococcal conjugate) Zostavax (shingles vaccine)   This is  One version of a  "Low GI"  Diet:  It will still lower your blood sugars and allow you to lose 4 to 8  lbs  per month if you follow it carefully.  Your goal with exercise is a minimum of 30 minutes of aerobic exercise 5 days per week (Walking does not count once it becomes easy!)    All of the foods can be found at grocery stores and in bulk at [employer].  The Atkins protein bars and shakes are available in more varieties at Target, WalMart and [employer]'s Foods.     7 AM Breakfast:  Choose from the following:  Low carbohydrate Protein  Shakes (I recommend the EAS AdvantEdge "Carb Control" shakes  Or the low carb shakes by Atkins.    2.5 carbs   Arnold's "Sandwhich Thin"toasted  w/ peanut butter (no jelly: about 20 net carbs  "Bagel Thin" with cream cheese and salmon: about 20 carbs   a scrambled egg/bacon/cheese burrito made with Mission's "carb balance" whole wheat tortilla  (about 10 net carbs )   Avoid cereal and bananas, oatmeal and cream of wheat and grits. They are loaded with carbohydrates!   10 AM: high protein snack  Protein bar by Atkins (the snack size, under 200 cal, usually < 6 net carbs).    A stick of cheese:  Around 1 carb,  100 cal     Dannon Light n Fit [place] Yogurt  (80 cal, 8 carbs)  Other so called "protein bars" and Greek yogurts tend to be loaded with carbohydrates.  Remember, in food advertising, the word "energy" is synonymous for " carbohydrate."  Lunch:   A Sandwich using the bread choices listed, Can use any  Eggs,  lunchmeat,  grilled meat or canned tuna), avocado, regular mayo/mustard  and cheese.  A Salad using blue cheese, ranch,  Goddess or vinagrette,  No croutons or "confetti" and no "candied nuts" but regular nuts OK.   No pretzels or chips.  Pickles and miniature sweet peppers are a good low carb alternative that provide a "crunch"  The bread is the only source of carbohydrate in a sandwich and  can be decreased by trying some of these alternatives to traditional loaf bread  Joseph's makes a pita bread and a flat bread that are 50 cal and 4 net carbs available at BJs and WalMart.  This can be toasted to use with hummous as well  Toufayan makes a low carb flatbread that's 100 cal and 9 net carbs available at [employer] and [employer] makes 2 sizes of  Low carb whole wheat tortilla  (The large one is 210 cal and 6 net carbs) Avoid "Low fat dressings, as well as Reyne Dumas and [address] dressings They are loaded with sugar!   3 PM/ Mid day  Snack:  Consider  1 ounce of  almonds, walnuts, pistachios, pecans, peanuts,  Macadamia nuts or a nut medley.  Avoid "granola"; the dried cranberries and raisins are  loaded with carbohydrates. Mixed nuts as long as there are no raisins,  cranberries or dried fruit.     6 PM  Dinner:     Meat/fowl/fish with a green salad, and either broccoli, cauliflower, green beans, spinach, brussel sprouts or  Lima beans. DO NOT BREAD THE PROTEIN!!      There is a low carb pasta by Dreamfield's that is acceptable and tastes great: only 5 digestible carbs/serving.( All grocery stores but BJs carry it )  Try Kai Levins Angelo's chicken piccata or chicken or eggplant parm over low carb pasta.(Lowes and BJs)   Clifton Custard Sanchez's "Carnitas" (pulled pork, no sauce,  0 carbs) or his beef pot roast to make a dinner burrito (at BJ's)  Pesto over low carb pasta (bj's sells a good quality pesto in the center refrigerated section of the deli   Whole wheat pasta is still full of digestible carbs and   Not as low in glycemic index as Dreamfield's.   Brown rice is still rice,  So skip the rice and noodles if you eat [place] or [place] (or at least limit to 1/2 cup)  9 PM snack :   Breyer's "low carb" fudgsicle or  ice cream bar (Carb Smart line), or  Weight Watcher's ice cream bar , or another "no sugar added" ice cream;  a serving of fresh berries/cherries with whipped cream   Cheese or DANNON'S LlGHT N FIT [place] YOGURT  Avoid bananas, pineapple, grapes  and watermelon on a regular basis because they are high in sugar.  THINK OF THEM AS DESSERT  Remember that snack Substitutions should be less than 10 NET carbs per serving and meals < 20 carbs. Remember to subtract fiber grams to get the "net carbs."

## 2013-05-12 LAB — VITAMIN D 25 HYDROXY (VIT D DEFICIENCY, FRACTURES): Vit D, 25-Hydroxy: 39 ng/mL (ref 30–89)

## 2013-05-13 ENCOUNTER — Encounter: Payer: Self-pay | Admitting: Internal Medicine

## 2013-05-13 DIAGNOSIS — Z0000 Encounter for general adult medical examination without abnormal findings: Secondary | ICD-10-CM | POA: Insufficient documentation

## 2013-05-13 NOTE — Assessment & Plan Note (Signed)
She was treated with alendronate for 5 years for osteopenia, which was stopped 2 years ago.  Last DEXA was 2011 at [place]. Repeat DEXA ordered to be done at [facility].

## 2013-05-13 NOTE — Assessment & Plan Note (Signed)
Annual comprehensive exam was done including breast, excluding pelvic and PAP smear. All screenings have been addressed .  

## 2013-05-13 NOTE — Assessment & Plan Note (Signed)
I have addressed  BMI and recommended wt loss of 10% of body weight over the next 6 months using a low glycemic index diet and regular exercise a minimum of 5 days per week.   

## 2013-05-13 NOTE — Assessment & Plan Note (Signed)
Well-controlled on diet alone. 

## 2013-10-05 ENCOUNTER — Other Ambulatory Visit: Payer: Self-pay | Admitting: Internal Medicine

## 2013-10-26 ENCOUNTER — Ambulatory Visit (INDEPENDENT_AMBULATORY_CARE_PROVIDER_SITE_OTHER): Payer: BC Managed Care – PPO | Admitting: Internal Medicine

## 2013-10-26 ENCOUNTER — Encounter: Payer: Self-pay | Admitting: Internal Medicine

## 2013-10-26 VITALS — BP 120/70 | HR 95 | Temp 98.6°F | Resp 16 | Wt 168.5 lb

## 2013-10-26 DIAGNOSIS — R300 Dysuria: Secondary | ICD-10-CM

## 2013-10-26 DIAGNOSIS — N390 Urinary tract infection, site not specified: Secondary | ICD-10-CM

## 2013-10-26 LAB — POCT URINALYSIS DIPSTICK
Bilirubin, UA: NEGATIVE
Glucose, UA: NEGATIVE
Ketones, UA: NEGATIVE
NITRITE UA: NEGATIVE
PROTEIN UA: NEGATIVE
Spec Grav, UA: 1.02
Urobilinogen, UA: 0.2
pH, UA: 5.5

## 2013-10-26 MED ORDER — SULFAMETHOXAZOLE-TRIMETHOPRIM 800-160 MG PO TABS: 1.0000 | ORAL_TABLET | Freq: Two times a day (BID) | ORAL | Status: DC

## 2013-10-26 NOTE — Patient Instructions (Addendum)
You appear to have a UTI  I have prescribed Septra DS to take 2 times daily  for 3 days  Please take a probiotic ( Align, Floraque or Culturelle) for 2 weeks if you start the antibiotic to prevent a serious antibiotic associated diarrhea  Called" clostridium dificile colitis" ( should also help prevent   vaginal yeast infection)

## 2013-10-26 NOTE — Progress Notes (Signed)
Pre-visit discussion using our clinic review tool. No additional management support is needed unless otherwise documented below in the visit note.  

## 2013-10-26 NOTE — Progress Notes (Signed)
Patient ID: Ashley Ward, female   DOB: 01-18-1951, 63 y.o.   MRN: [id number]  Patient Active Problem List   Diagnosis Date Noted  . Routine general medical examination at a health care facility 05/13/2013  . UTI (lower urinary tract infection) 07/14/2012  . Screening for colon cancer 04/26/2012  . Overweight (BMI 25.0-29.9) 01/05/2012  . Other and unspecified hyperlipidemia 07/08/2011  . Osteopenia 07/08/2011  . Menopause     Subjective:  CC:   Chief Complaint  Patient presents with  . Urinary Tract Infection    polyuria, small amounts, lower pelvic heaviness.    HPI:   Ashley Ward is a 63 y.o. female who presents for suprapubic heaviness,  Urinary   Hesitancy for the last week.  No recent instrumentation or sexual intercourse..  No fevers, nausea or  Back pain   Past Medical History  Diagnosis Date  . Menopause   . Hyperlipidemia   . Menopause     Past Surgical History  Procedure Laterality Date  . Dilation and curettage, diagnostic / therapeutic  2007    [place],  for spotting, fresolved       The following portions of the patient's history were reviewed and updated as appropriate: Allergies, current medications, and problem list.    Review of Systems:   Patient denies headache, fevers, malaise, unintentional weight loss, skin rash, eye pain, sinus congestion and sinus pain, sore throat, dysphagia,  hemoptysis , cough, dyspnea, wheezing, chest pain, palpitations, orthopnea, edema, abdominal pain, nausea, melena, diarrhea, constipation, flank pain,  Hematuria, numbness, tingling, seizures,  Focal weakness, Loss of consciousness,  Tremor, insomnia, depression, anxiety, and suicidal ideation.     History   Social History  . Marital Status: Married    Spouse Name: N/A    Number of Children: N/A  . Years of Education: N/A   Occupational History  . Not on file.   Social History Main Topics  . Smoking status: Never Smoker   . Smokeless tobacco: Never  Used  . Alcohol Use: 2.4 oz/week    4 Glasses of wine per week  . Drug Use: No  . Sexual Activity: Yes   Other Topics Concern  . Not on file   Social History Narrative  . No narrative on file    Objective:  Filed Vitals:   10/26/13 1421  BP: 120/70  Pulse: 95  Temp: 98.6 F (37 C)  Resp: 16     General appearance: alert, cooperative and appears stated age  Back: symmetric, no curvature. ROM normal. No CVA tenderness. Lungs: clear to auscultation bilaterally Heart: regular rate and rhythm, S1, S2 normal, no murmur, click, rub or gallop Abdomen: soft, non-tender; bowel sounds normal; no masses,  no organomegaly Pulses: 2+ and symmetric Skin: Skin color, texture, turgor normal. No rashes or lesions Lymph nodes: Cervical, supraclavicular, and axillary nodes normal.  Assessment and Plan:  UTI (lower urinary tract infection) Empiric antibiotics for abnormal UA. Culture pending. Concurrent use of probiotics advised.     Updated Medication List Outpatient Encounter Prescriptions as of 10/26/2013  Medication Sig  . cholecalciferol (VITAMIN D) 1000 UNITS tablet Take 1,000 Units by mouth daily.  Marland Kitchen estrogen, conjugated,-medroxyprogesterone (PREMPRO) 0.3-1.5 MG per tablet USE AS PER PACKAGE INSTRUCTIONS TAKE 0.5 TABLET EVERY DAY  . Multiple Vitamin (MULTIVITAMIN) tablet Take 1 tablet by mouth daily.  . [DISCONTINUED] PREMPRO 0.3-1.5 MG per tablet USE AS PER PACKAGE INSTRUCTIONS TAKE 1 TABLET EVERY DAY  . sulfamethoxazole-trimethoprim (SEPTRA DS) 800-160 MG per tablet  Take 1 tablet by mouth 2 (two) times daily.     Orders Placed This Encounter  Procedures  . CULTURE, URINE COMPREHENSIVE  . POCT Urinalysis Dipstick    No Follow-up on file.

## 2013-10-29 ENCOUNTER — Encounter: Payer: Self-pay | Admitting: Internal Medicine

## 2013-10-29 LAB — CULTURE, URINE COMPREHENSIVE: Colony Count: >=100000

## 2013-10-29 NOTE — Addendum Note (Signed)
Addended by: Crecencio Mc on: 10/29/2013 01:19 PM   Modules accepted: Level of Service

## 2013-10-29 NOTE — Assessment & Plan Note (Addendum)
Empiric antibiotics for abnormal UA. Culture pending. Concurrent use of probiotics advised.

## 2014-05-25 ENCOUNTER — Encounter: Payer: Self-pay | Admitting: Internal Medicine

## 2014-05-25 ENCOUNTER — Ambulatory Visit (INDEPENDENT_AMBULATORY_CARE_PROVIDER_SITE_OTHER): Payer: BC Managed Care – PPO | Admitting: Internal Medicine

## 2014-05-25 VITALS — BP 118/68 | HR 73 | Temp 98.2°F | Resp 14 | Ht 66.0 in | Wt 173.0 lb

## 2014-05-25 DIAGNOSIS — E663 Overweight: Secondary | ICD-10-CM

## 2014-05-25 DIAGNOSIS — Z1211 Encounter for screening for malignant neoplasm of colon: Secondary | ICD-10-CM

## 2014-05-25 DIAGNOSIS — R21 Rash and other nonspecific skin eruption: Secondary | ICD-10-CM

## 2014-05-25 DIAGNOSIS — Z0000 Encounter for general adult medical examination without abnormal findings: Secondary | ICD-10-CM

## 2014-05-25 DIAGNOSIS — Z1239 Encounter for other screening for malignant neoplasm of breast: Secondary | ICD-10-CM

## 2014-05-25 DIAGNOSIS — E785 Hyperlipidemia, unspecified: Secondary | ICD-10-CM

## 2014-05-25 DIAGNOSIS — M8580 Other specified disorders of bone density and structure, unspecified site: Secondary | ICD-10-CM

## 2014-05-25 DIAGNOSIS — R5383 Other fatigue: Secondary | ICD-10-CM

## 2014-05-25 LAB — CBC WITH DIFFERENTIAL/PLATELET
Basophils Absolute: 0 10*3/uL (ref 0.0–0.1)
Basophils Relative: 0.4 % (ref 0.0–3.0)
EOS ABS: 0.2 10*3/uL (ref 0.0–0.7)
EOS PCT: 2.4 % (ref 0.0–5.0)
HCT: 41.9 % (ref 36.0–46.0)
HEMOGLOBIN: 13.6 g/dL (ref 12.0–15.0)
Lymphocytes Relative: 38.4 % (ref 12.0–46.0)
Lymphs Abs: 2.4 10*3/uL (ref 0.7–4.0)
MCHC: 32.5 g/dL (ref 30.0–36.0)
MCV: 95.3 fl (ref 78.0–100.0)
MONO ABS: 0.6 10*3/uL (ref 0.1–1.0)
Monocytes Relative: 9.2 % (ref 3.0–12.0)
NEUTROS ABS: 3 10*3/uL (ref 1.4–7.7)
Neutrophils Relative %: 49.6 % (ref 43.0–77.0)
Platelets: 252 10*3/uL (ref 150.0–400.0)
RBC: 4.39 Mil/uL (ref 3.87–5.11)
RDW: 12.6 % (ref 11.5–15.5)
WBC: 6.1 10*3/uL (ref 4.0–10.5)

## 2014-05-25 LAB — LIPID PANEL
CHOL/HDL RATIO: 3
Cholesterol: 234 mg/dL — ABNORMAL HIGH (ref 0–200)
HDL: 74 mg/dL (ref >39.00)
LDL Cholesterol: 143 mg/dL — ABNORMAL HIGH (ref 0–99)
NONHDL: 160
Triglycerides: 83 mg/dL (ref 0.0–149.0)
VLDL: 16.6 mg/dL (ref 0.0–40.0)

## 2014-05-25 LAB — COMPREHENSIVE METABOLIC PANEL
ALT: 15 U/L (ref 0–35)
AST: 19 U/L (ref 0–37)
Albumin: 4.2 g/dL (ref 3.5–5.2)
Alkaline Phosphatase: 52 U/L (ref 39–117)
BUN: 20 mg/dL (ref 6–23)
CALCIUM: 9.4 mg/dL (ref 8.4–10.5)
CHLORIDE: 102 meq/L (ref 96–112)
CO2: 28 meq/L (ref 19–32)
CREATININE: 0.7 mg/dL (ref 0.4–1.2)
GFR: 92.78 mL/min (ref >60.00)
GLUCOSE: 91 mg/dL (ref 70–99)
Potassium: 4.5 mEq/L (ref 3.5–5.1)
Sodium: 136 mEq/L (ref 135–145)
Total Bilirubin: 0.6 mg/dL (ref 0.2–1.2)
Total Protein: 6.7 g/dL (ref 6.0–8.3)

## 2014-05-25 MED ORDER — TRIAMCINOLONE ACETONIDE 0.1 % EX CREA: 1.0000 "application " | TOPICAL_CREAM | Freq: Two times a day (BID) | CUTANEOUS | Status: DC

## 2014-05-25 NOTE — Progress Notes (Signed)
Patient ID: JOCELYNNE DUQUETTE, female   DOB: 12/20/1950, 63 y.o.   MRN: [id number]  Subjective:     EBBA GOLL is a 63 y.o. female and is here for a comprehensive physical exam. The patient reports no problems.  History   Social History  . Marital Status: Married    Spouse Name: N/A    Number of Children: N/A  . Years of Education: N/A   Occupational History  . Not on file.   Social History Main Topics  . Smoking status: Never Smoker   . Smokeless tobacco: Never Used  . Alcohol Use: 2.4 oz/week    4 Glasses of wine per week  . Drug Use: No  . Sexual Activity: Yes   Other Topics Concern  . Not on file   Social History Narrative   Health Maintenance  Topic Date Due  . ZOSTAVAX  01/25/2011  . COLONOSCOPY  01/05/2012  . INFLUENZA VACCINE  01/15/2015  . MAMMOGRAM  04/06/2015  . PAP SMEAR  04/27/2015  . TETANUS/TDAP  03/20/2024    The following portions of the patient's history were reviewed and updated as appropriate: allergies, current medications, past family history, past medical history, past social history, past surgical history and problem list.  Review of Systems A comprehensive review of systems was negative.   Objective:   BP 118/68 mmHg  Pulse 73  Temp(Src) 98.2 F (36.8 C) (Oral)  Resp 14  Ht 5\' 6"  (1.676 m)  Wt 173 lb (78.472 kg)  BMI 27.94 kg/m2  SpO2 98%  General appearance: alert, cooperative and appears stated age Head: Normocephalic, without obvious abnormality, atraumatic Eyes: conjunctivae/corneas clear. PERRL, EOM's intact. Fundi benign. Ears: normal TM's and external ear canals both ears Nose: Nares normal. Septum midline. Mucosa normal. No drainage or sinus tenderness. Throat: lips, mucosa, and tongue normal; teeth and gums normal Neck: no adenopathy, no carotid bruit, no JVD, supple, symmetrical, trachea midline and thyroid not enlarged, symmetric, no tenderness/mass/nodules Lungs: clear to auscultation bilaterally Breasts: normal  appearance, no masses or tenderness Heart: regular rate and rhythm, S1, S2 normal, no murmur, click, rub or gallop Abdomen: soft, non-tender; bowel sounds normal; no masses,  no organomegaly Extremities: extremities normal, atraumatic, no cyanosis or edema Pulses: 2+ and symmetric Skin: Skin color, texture, turgor normal. No rashes or lesions Neurologic: Alert and oriented X 3, normal strength and tone. Normal symmetric reflexes. Normal coordination and gait.         .Assessment and Plan:    Problem List Items Addressed This Visit      Musculoskeletal and Integument   Osteopenia   Relevant Orders      DG Bone Density   Rash and nonspecific skin eruption    Behind both ears,  Macular,  Pruritic,  Triamcinolone ointment. Trial       Other   Overweight (BMI 25.0-29.9)    I have addressed  BMI and recommended wt loss of 10% of body weight over the next 6 months using a low glycemic index diet and regular exercise a minimum of 5 days per week.      Routine general medical examination at a health care facility    Annual wellness  exam was done as well as a comprehensive physical exam and management of acute and chronic conditions .  During the course of the visit the patient was educated and counseled about appropriate screening and preventive services including :  diabetes screening, lipid analysis with projected  10 year  risk for CAD , nutrition counseling, colorectal cancer screening, and recommended immunizations.  Printed recommendations for health maintenance screenings was given.      Other Visit Diagnoses    Breast cancer screening    -  Primary    Relevant Orders       MM DIGITAL SCREENING BILATERAL    Other fatigue        Relevant Orders       CBC with Differential (Completed)       Comprehensive metabolic panel (Completed)    Hyperlipidemia        Relevant Orders       Lipid panel (Completed)    Colon cancer screening        Relevant Orders       Fecal occult  blood, imunochemical

## 2014-05-25 NOTE — Patient Instructions (Signed)
You had your annual  wellness exam today.  We will repeat your PAP smear in 2016, (your last one!)  We will schedule your mammogram soon at [facility] (3d this time)  I will send triamcinolone ointment to use on your rash to your pharmacy  Please  Use the stool card for your annual colon ca screening test. At your earliest convenience  We will contact you with the bloodwork results  Health Maintenance Adopting a healthy lifestyle and getting preventive care can go a long way to promote health and wellness. Talk with your health care provider about what schedule of regular examinations is right for you. This is a good chance for you to check in with your provider about disease prevention and staying healthy. In between checkups, there are plenty of things you can do on your own. Experts have done a lot of research about which lifestyle changes and preventive measures are most likely to keep you healthy. Ask your health care provider for more information. WEIGHT AND DIET  Eat a healthy diet  Be sure to include plenty of vegetables, fruits, low-fat dairy products, and lean protein.  Do not eat a lot of foods high in solid fats, added sugars, or salt.  Get regular exercise. This is one of the most important things you can do for your health.  Most adults should exercise for at least 150 minutes each week. The exercise should increase your heart rate and make you sweat (moderate-intensity exercise).  Most adults should also do strengthening exercises at least twice a week. This is in addition to the moderate-intensity exercise.  Maintain a healthy weight  Body mass index (BMI) is a measurement that can be used to identify possible weight problems. It estimates body fat based on height and weight. Your health care provider can help determine your BMI and help you achieve or maintain a healthy weight.  For females 6 years of age and older:   A BMI below 18.5 is considered underweight.  A  BMI of 18.5 to 24.9 is normal.  A BMI of 25 to 29.9 is considered overweight.  A BMI of 30 and above is considered obese.  Watch levels of cholesterol and blood lipids  You should start having your blood tested for lipids and cholesterol at 63 years of age, then have this test every 5 years.  You may need to have your cholesterol levels checked more often if:  Your lipid or cholesterol levels are high.  You are older than 63 years of age.  You are at high risk for heart disease.  CANCER SCREENING   Lung Cancer  Lung cancer screening is recommended for adults 47-64 years old who are at high risk for lung cancer because of a history of smoking.  A yearly low-dose CT scan of the lungs is recommended for people who:  Currently smoke.  Have quit within the past 15 years.  Have at least a 30-pack-year history of smoking. A pack year is smoking an average of one pack of cigarettes a day for 1 year.  Yearly screening should continue until it has been 15 years since you quit.  Yearly screening should stop if you develop a health problem that would prevent you from having lung cancer treatment.  Breast Cancer  Practice breast self-awareness. This means understanding how your breasts normally appear and feel.  It also means doing regular breast self-exams. Let your health care provider know about any changes, no matter how small.  If  you are in your 20s or 30s, you should have a clinical breast exam (CBE) by a health care provider every 1-3 years as part of a regular health exam.  If you are 40 or older, have a CBE every year. Also consider having a breast X-ray (mammogram) every year.  If you have a family history of breast cancer, talk to your health care provider about genetic screening.  If you are at high risk for breast cancer, talk to your health care provider about having an MRI and a mammogram every year.  Breast cancer gene (BRCA) assessment is recommended for women  who have family members with BRCA-related cancers. BRCA-related cancers include:  Breast.  Ovarian.  Tubal.  Peritoneal cancers.  Results of the assessment will determine the need for genetic counseling and BRCA1 and BRCA2 testing. Cervical Cancer Routine pelvic examinations to screen for cervical cancer are no longer recommended for nonpregnant women who are considered low risk for cancer of the pelvic organs (ovaries, uterus, and vagina) and who do not have symptoms. A pelvic examination may be necessary if you have symptoms including those associated with pelvic infections. Ask your health care provider if a screening pelvic exam is right for you.   The Pap test is the screening test for cervical cancer for women who are considered at risk.  If you had a hysterectomy for a problem that was not cancer or a condition that could lead to cancer, then you no longer need Pap tests.  If you are older than 65 years, and you have had normal Pap tests for the past 10 years, you no longer need to have Pap tests.  If you have had past treatment for cervical cancer or a condition that could lead to cancer, you need Pap tests and screening for cancer for at least 20 years after your treatment.  If you no longer get a Pap test, assess your risk factors if they change (such as having a new sexual partner). This can affect whether you should start being screened again.  Some women have medical problems that increase their chance of getting cervical cancer. If this is the case for you, your health care provider may recommend more frequent screening and Pap tests.  The human papillomavirus (HPV) test is another test that may be used for cervical cancer screening. The HPV test looks for the virus that can cause cell changes in the cervix. The cells collected during the Pap test can be tested for HPV.  The HPV test can be used to screen women 30 years of age and older. Getting tested for HPV can extend the  interval between normal Pap tests from three to five years.  An HPV test also should be used to screen women of any age who have unclear Pap test results.  After 63 years of age, women should have HPV testing as often as Pap tests.  Colorectal Cancer  This type of cancer can be detected and often prevented.  Routine colorectal cancer screening usually begins at 63 years of age and continues through 63 years of age.  Your health care provider may recommend screening at an earlier age if you have risk factors for colon cancer.  Your health care provider may also recommend using home test kits to check for hidden blood in the stool.  A small camera at the end of a tube can be used to examine your colon directly (sigmoidoscopy or colonoscopy). This is done to check for the   earliest forms of colorectal cancer.  Routine screening usually begins at age 69.  Direct examination of the colon should be repeated every 5-10 years through 63 years of age. However, you may need to be screened more often if early forms of precancerous polyps or small growths are found. Skin Cancer  Check your skin from head to toe regularly.  Tell your health care provider about any new moles or changes in moles, especially if there is a change in a mole's shape or color.  Also tell your health care provider if you have a mole that is larger than the size of a pencil eraser.  Always use sunscreen. Apply sunscreen liberally and repeatedly throughout the day.  Protect yourself by wearing long sleeves, pants, a wide-brimmed hat, and sunglasses whenever you are outside. HEART DISEASE, DIABETES, AND HIGH BLOOD PRESSURE   Have your blood pressure checked at least every 1-2 years. High blood pressure causes heart disease and increases the risk of stroke.  If you are between 14 years and 69 years old, ask your health care provider if you should take aspirin to prevent strokes.  Have regular diabetes screenings. This  involves taking a blood sample to check your fasting blood sugar level.  If you are at a normal weight and have a low risk for diabetes, have this test once every three years after 63 years of age.  If you are overweight and have a high risk for diabetes, consider being tested at a younger age or more often. PREVENTING INFECTION  Hepatitis B  If you have a higher risk for hepatitis B, you should be screened for this virus. You are considered at high risk for hepatitis B if:  You were born in a country where hepatitis B is common. Ask your health care provider which countries are considered high risk.  Your parents were born in a high-risk country, and you have not been immunized against hepatitis B (hepatitis B vaccine).  You have HIV or AIDS.  You use needles to inject street drugs.  You live with someone who has hepatitis B.  You have had sex with someone who has hepatitis B.  You get hemodialysis treatment.  You take certain medicines for conditions, including cancer, organ transplantation, and autoimmune conditions. Hepatitis C  Blood testing is recommended for:  Everyone born from [birth year] through 1965.  Anyone with known risk factors for hepatitis C. Sexually transmitted infections (STIs)  You should be screened for sexually transmitted infections (STIs) including gonorrhea and chlamydia if:  You are sexually active and are younger than 63 years of age.  You are older than 63 years of age and your health care provider tells you that you are at risk for this type of infection.  Your sexual activity has changed since you were last screened and you are at an increased risk for chlamydia or gonorrhea. Ask your health care provider if you are at risk.  If you do not have HIV, but are at risk, it may be recommended that you take a prescription medicine daily to prevent HIV infection. This is called pre-exposure prophylaxis (PrEP). You are considered at risk if:  You are  sexually active and do not regularly use condoms or know the HIV status of your partner(s).  You take drugs by injection.  You are sexually active with a partner who has HIV. Talk with your health care provider about whether you are at high risk of being infected with HIV. If you choose  to begin PrEP, you should first be tested for HIV. You should then be tested every 3 months for as long as you are taking PrEP.  PREGNANCY   If you are premenopausal and you may become pregnant, ask your health care provider about preconception counseling.  If you may become pregnant, take 400 to 800 micrograms (mcg) of folic acid every day.  If you want to prevent pregnancy, talk to your health care provider about birth control (contraception). OSTEOPOROSIS AND MENOPAUSE   Osteoporosis is a disease in which the bones lose minerals and strength with aging. This can result in serious bone fractures. Your risk for osteoporosis can be identified using a bone density scan.  If you are 83 years of age or older, or if you are at risk for osteoporosis and fractures, ask your health care provider if you should be screened.  Ask your health care provider whether you should take a calcium or vitamin D supplement to lower your risk for osteoporosis.  Menopause may have certain physical symptoms and risks.  Hormone replacement therapy may reduce some of these symptoms and risks. Talk to your health care provider about whether hormone replacement therapy is right for you.  HOME CARE INSTRUCTIONS   Schedule regular health, dental, and eye exams.  Stay current with your immunizations.   Do not use any tobacco products including cigarettes, chewing tobacco, or electronic cigarettes.  If you are pregnant, do not drink alcohol.  If you are breastfeeding, limit how much and how often you drink alcohol.  Limit alcohol intake to no more than 1 drink per day for nonpregnant women. One drink equals 12 ounces of beer, 5  ounces of wine, or 1 ounces of hard liquor.  Do not use street drugs.  Do not share needles.  Ask your health care provider for help if you need support or information about quitting drugs.  Tell your health care provider if you often feel depressed.  Tell your health care provider if you have ever been abused or do not feel safe at home. Document Released: 12/16/2010 Document Revised: 10/17/2013 Document Reviewed: 05/04/2013 [facility] Patient Information 2015 [place], [place]. This information is not intended to replace advice given to you by your health care provider. Make sure you discuss any questions you have with your health care provider.

## 2014-05-25 NOTE — Progress Notes (Signed)
Pre-visit discussion using our clinic review tool. No additional management support is needed unless otherwise documented below in the visit note.  

## 2014-05-28 ENCOUNTER — Encounter: Payer: Self-pay | Admitting: Internal Medicine

## 2014-05-28 DIAGNOSIS — R21 Rash and other nonspecific skin eruption: Secondary | ICD-10-CM | POA: Insufficient documentation

## 2014-05-28 NOTE — Assessment & Plan Note (Signed)

## 2014-05-28 NOTE — Assessment & Plan Note (Signed)
Behind bth ears,  Macular,  Pruritic,  Triamcinolone ointment. Trial

## 2014-05-28 NOTE — Assessment & Plan Note (Signed)
I have addressed  BMI and recommended wt loss of 10% of body weight over the next 6 months using a low glycemic index diet and regular exercise a minimum of 5 days per week.   

## 2014-07-03 ENCOUNTER — Ambulatory Visit: Payer: Self-pay | Admitting: Internal Medicine

## 2014-07-03 LAB — HM DEXA SCAN

## 2014-07-06 ENCOUNTER — Telehealth: Payer: Self-pay | Admitting: Internal Medicine

## 2014-07-06 NOTE — Telephone Encounter (Signed)
Her bone density test shows osteopenia, but was done at [facility], not [place] [facility] so I have no comparison one.  I do not have the prior [facility] one for comparison so I cannot tell her it the bones have weakened since stopping alendronate.     Would repeat in 2 years and consider therapy then IF THE T SCORES INDICATE OSTEOPOROSIS . Continue calcium, vitamin d and weight bearing exercise on a regular basis.   I recommend getting the half of your calcium and Vitamin D  Requirements through dietary sources rather than supplements given the recent association of calcium supplements with increased coronary artery calcium scores (You need 1800 mg daily )   Unsweetened almond/coconut milk, Cashew and soy  Milks are all great low calorie low carb, cholesterol free sources of dietary calcium and vitamin D.

## 2014-07-06 NOTE — Telephone Encounter (Signed)
Patient notified and voiced understanding.

## 2014-10-06 ENCOUNTER — Other Ambulatory Visit: Payer: Self-pay | Admitting: Internal Medicine

## 2014-10-09 ENCOUNTER — Other Ambulatory Visit: Payer: Self-pay | Admitting: Internal Medicine

## 2015-05-28 ENCOUNTER — Ambulatory Visit (INDEPENDENT_AMBULATORY_CARE_PROVIDER_SITE_OTHER): Payer: BLUE CROSS/BLUE SHIELD | Admitting: Internal Medicine

## 2015-05-28 ENCOUNTER — Other Ambulatory Visit (HOSPITAL_COMMUNITY)
Admission: RE | Admit: 2015-05-28 | Discharge: 2015-05-28 | Disposition: A | Discharge status: Home / Self Care | Payer: BLUE CROSS/BLUE SHIELD | Source: Ambulatory Visit | Attending: Internal Medicine | Admitting: Internal Medicine

## 2015-05-28 ENCOUNTER — Encounter: Payer: Self-pay | Admitting: Internal Medicine

## 2015-05-28 VITALS — BP 124/70 | HR 70 | Temp 98.0°F | Resp 12 | Ht 66.0 in | Wt 178.5 lb

## 2015-05-28 DIAGNOSIS — E785 Hyperlipidemia, unspecified: Secondary | ICD-10-CM | POA: Diagnosis not present

## 2015-05-28 DIAGNOSIS — Z1151 Encounter for screening for human papillomavirus (HPV): Secondary | ICD-10-CM | POA: Insufficient documentation

## 2015-05-28 DIAGNOSIS — Z1159 Encounter for screening for other viral diseases: Secondary | ICD-10-CM

## 2015-05-28 DIAGNOSIS — Z0000 Encounter for general adult medical examination without abnormal findings: Secondary | ICD-10-CM | POA: Diagnosis not present

## 2015-05-28 DIAGNOSIS — Z01419 Encounter for gynecological examination (general) (routine) without abnormal findings: Secondary | ICD-10-CM | POA: Diagnosis present

## 2015-05-28 DIAGNOSIS — Z1211 Encounter for screening for malignant neoplasm of colon: Secondary | ICD-10-CM

## 2015-05-28 DIAGNOSIS — Z124 Encounter for screening for malignant neoplasm of cervix: Secondary | ICD-10-CM

## 2015-05-28 DIAGNOSIS — E663 Overweight: Secondary | ICD-10-CM | POA: Diagnosis not present

## 2015-05-28 DIAGNOSIS — Z780 Asymptomatic menopausal state: Secondary | ICD-10-CM

## 2015-05-28 DIAGNOSIS — Z1239 Encounter for other screening for malignant neoplasm of breast: Secondary | ICD-10-CM

## 2015-05-28 DIAGNOSIS — E559 Vitamin D deficiency, unspecified: Secondary | ICD-10-CM

## 2015-05-28 DIAGNOSIS — R635 Abnormal weight gain: Secondary | ICD-10-CM

## 2015-05-28 LAB — LIPID PANEL
CHOL/HDL RATIO: 4
Cholesterol: 252 mg/dL — ABNORMAL HIGH (ref 0–200)
HDL: 68.1 mg/dL (ref >39.00)
LDL Cholesterol: 164 mg/dL — ABNORMAL HIGH (ref 0–99)
NONHDL: 183.85
Triglycerides: 101 mg/dL (ref 0.0–149.0)
VLDL: 20.2 mg/dL (ref 0.0–40.0)

## 2015-05-28 LAB — COMPREHENSIVE METABOLIC PANEL
ALT: 13 U/L (ref 0–35)
AST: 16 U/L (ref 0–37)
Albumin: 4.3 g/dL (ref 3.5–5.2)
Alkaline Phosphatase: 60 U/L (ref 39–117)
BUN: 19 mg/dL (ref 6–23)
CALCIUM: 9.9 mg/dL (ref 8.4–10.5)
CHLORIDE: 103 meq/L (ref 96–112)
CO2: 30 mEq/L (ref 19–32)
CREATININE: 0.77 mg/dL (ref 0.40–1.20)
GFR: 80.13 mL/min (ref >60.00)
Glucose, Bld: 103 mg/dL — ABNORMAL HIGH (ref 70–99)
Potassium: 4.5 mEq/L (ref 3.5–5.1)
Sodium: 141 mEq/L (ref 135–145)
Total Bilirubin: 0.5 mg/dL (ref 0.2–1.2)
Total Protein: 6.9 g/dL (ref 6.0–8.3)

## 2015-05-28 LAB — VITAMIN D 25 HYDROXY (VIT D DEFICIENCY, FRACTURES): VITD: 30.51 ng/mL (ref 30.00–100.00)

## 2015-05-28 LAB — TSH: TSH: 3.29 u[IU]/mL (ref 0.35–4.50)

## 2015-05-28 NOTE — Progress Notes (Signed)
Pre-visit discussion using our clinic review tool. No additional management support is needed unless otherwise documented below in the visit note.  

## 2015-05-28 NOTE — Patient Instructions (Signed)

## 2015-05-28 NOTE — Progress Notes (Signed)
Patient ID: Ashley Ward, female    DOB: 1950/08/13  Age: 64 y.o. MRN: [id number]  The patient is here for annual wellness examination and management of other chronic and acute problems.   HM:   DEXA JAN 2016  OSTEOPENIA -2.0   MAMMOGRAM LAST ONE 2014  PAP COLONOSCOPY  2003  DISCUSSED OPTIONS,  GIVEN UNKNOWN FH ON FATHER'S SIDE,  REC ONE MORE COLONOSCOPY ANNUAL DERM BY ISENSTIEN   The risk factors are reflected in the social history.  The roster of all physicians providing medical care to patient - is listed in the Snapshot section of the chart.  Activities of daily living:  The patient is 100% independent in all ADLs: dressing, toileting, feeding as well as independent mobility  Home safety : The patient has smoke detectors in the home. They wear seatbelts.  There are no firearms at home. There is no violence in the home.   There is no risks for hepatitis, STDs or HIV. There is no   history of blood transfusion. They have no travel history to infectious disease endemic areas of the world.  The patient has seen their dentist in the last six month. They have seen their eye doctor in the last year. They admit to slight hearing difficulty with regard to whispered voices and some television programs.  They have deferred audiologic testing in the last year.  They do not  have excessive sun exposure. Discussed the need for sun protection: hats, long sleeves and use of sunscreen if there is significant sun exposure.   Diet: the importance of a healthy diet is discussed. They do have a healthy diet.  The benefits of regular aerobic exercise were discussed. She walks 4 times per week ,  20 minutes.   Depression screen: there are no signs or vegative symptoms of depression- irritability, change in appetite, anhedonia, sadness/tearfullness.  Cognitive assessment: the patient manages all their financial and personal affairs and is actively engaged. They could relate day,date,year and events;  recalled 2/3 objects at 3 minutes; performed clock-face test normally.  The following portions of the patient's history were reviewed and updated as appropriate: allergies, current medications, past family history, past medical history,  past surgical history, past social history  and problem list.  Visual acuity was not assessed per patient preference since she has regular follow up with her ophthalmologist. Hearing and body mass index were assessed and reviewed.   During the course of the visit the patient was educated and counseled about appropriate screening and preventive services including : fall prevention , diabetes screening, nutrition counseling, colorectal cancer screening, and recommended immunizations.    CC: The primary encounter diagnosis was Colon cancer screening. Diagnoses of Breast cancer screening, Vitamin D deficiency, Hyperlipidemia, Weight gain, Need for hepatitis C screening test, Cervical cancer screening, Encounter for preventive health examination, Screening for colon cancer, Menopause, Hyperlipidemia LDL goal <160, and Overweight (BMI 25.0-29.9) were also pertinent to this visit.  History Ashley Ward has a past medical history of Menopause; Hyperlipidemia; and Menopause.   She has past surgical history that includes Dilation and curettage, diagnostic / therapeutic (2007).   Her family history includes Alcohol abuse in her sister; COPD in her father; Cancer in her mother and sister; Diabetes in her mother.She reports that she has never smoked. She has never used smokeless tobacco. She reports that she drinks about 2.4 oz of alcohol per week. She reports that she does not use illicit drugs.  Outpatient Prescriptions Prior to Visit  Medication Sig Dispense Refill  . cholecalciferol (VITAMIN D) 1000 UNITS tablet Take 1,000 Units by mouth daily.    . Multiple Vitamin (MULTIVITAMIN) tablet Take 1 tablet by mouth daily.    Marland Kitchen PREMPRO 0.3-1.5 MG per tablet USE AS PER PACKAGE  INSTRUCTIONS TAKE 1 TABLET EVERY DAY 28 tablet 11  . sulfamethoxazole-trimethoprim (SEPTRA DS) 800-160 MG per tablet Take 1 tablet by mouth 2 (two) times daily. (Patient not taking: Reported on 05/25/2014) 6 tablet 0  . triamcinolone cream (KENALOG) 0.1 % Apply 1 application topically 2 (two) times daily. 30 g 0   No facility-administered medications prior to visit.    Review of Systems   Patient denies headache, fevers, malaise, unintentional weight loss, skin rash, eye pain, sinus congestion and sinus pain, sore throat, dysphagia,  hemoptysis , cough, dyspnea, wheezing, chest pain, palpitations, orthopnea, edema, abdominal pain, nausea, melena, diarrhea, constipation, flank pain, dysuria, hematuria, urinary  Frequency, nocturia, numbness, tingling, seizures,  Focal weakness, Loss of consciousness,  Tremor, insomnia, depression, anxiety, and suicidal ideation.      Objective:  BP 124/70 mmHg  Pulse 70  Temp(Src) 98 F (36.7 C) (Oral)  Resp 12  Ht 5\' 6"  (1.676 m)  Wt 178 lb 8 oz (80.967 kg)  BMI 28.82 kg/m2  SpO2 97%  Physical Exam   General Appearance:    Alert, cooperative, no distress, appears stated age  Head:    Normocephalic, without obvious abnormality, atraumatic  Eyes:    PERRL, conjunctiva/corneas clear, EOM's intact, fundi    benign, both eyes  Ears:    Normal TM's and external ear canals, both ears  Nose:   Nares normal, septum midline, mucosa normal, no drainage    or sinus tenderness  Throat:   Lips, mucosa, and tongue normal; teeth and gums normal  Neck:   Supple, symmetrical, trachea midline, no adenopathy;    thyroid:  no enlargement/tenderness/nodules; no carotid   bruit or JVD  Back:     Symmetric, no curvature, ROM normal, no CVA tenderness  Lungs:     Clear to auscultation bilaterally, respirations unlabored  Chest Wall:    No tenderness or deformity   Heart:    Regular rate and rhythm, S1 and S2 normal, no murmur, rub   or gallop  Breast Exam:    No  tenderness, masses, or nipple abnormality  Abdomen:     Soft, non-tender, bowel sounds active all four quadrants,    no masses, no organomegaly  Genitalia:    Pelvic: cervix normal in appearance, external genitalia normal, no adnexal masses or tenderness, no cervical motion tenderness, rectovaginal septum normal, uterus normal size, shape, and consistency and vagina normal without discharge  Extremities:   Extremities normal, atraumatic, no cyanosis or edema  Pulses:   2+ and symmetric all extremities  Skin:   Skin color, texture, turgor normal, no rashes or lesions  Lymph nodes:   Cervical, supraclavicular, and axillary nodes normal  Neurologic:   CNII-XII intact, normal strength, sensation and reflexes    throughout       Assessment & Plan:   Problem List Items Addressed This Visit    Hyperlipidemia LDL goal <160    Based on current fasting cholesterol  And an LDL of 164,  10 year risk of having some type of coronary event (including heart attack) is 8% , meaning that one of  every 12 women with the same medical statistics will have a heart attack in the next 10 years.  The [employer] recommends starting patients on moderate intensity statin therapy for people with this risk  to lower your risks of these events. Will recommend that patient consider  starting simvastatin The alternative is to try dietary modification,  regular exercise and weight loss and repeat the lipids in 6 months, prior to your next visit .     Please let me know what you would like to do    Lab Results  Component Value Date   CHOL 252* 05/28/2015   HDL 68.10 05/28/2015   LDLCALC 164* 05/28/2015   LDLDIRECT 115.5 05/11/2013   TRIG 101.0 05/28/2015   CHOLHDL 4 05/28/2015         Menopause    At last visit we discussed tapering her off of her hormone replacement therapy last winter but did not tolerate weaning. She has been on hormone replacement therapy prior to diagnosis of  hyperlipidemia.05/29/2015.        Overweight (BMI 25.0-29.9)    I have addressed  Her weight gain and recommended using a low glycemic index diet and regular exercise a minimum of 5 days per week.        Screening for colon cancer    She is overdue for 10 year follow up with Dr Vira Agar.      Encounter for preventive health examination    Annual comprehensive preventive exam was done as well as an evaluation and management of chronic conditions .  During the course of the visit the patient was educated and counseled about appropriate screening and preventive services including :  diabetes screening, lipid analysis with projected  10 year  risk for CAD  using the Framingham risk calculator for women, , nutrition counseling, colorectal cancer screening, and recommended immunizations.  Printed recommendations for health maintenance screenings was given.         Other Visit Diagnoses    Colon cancer screening    -  Primary    Relevant Orders    Ambulatory referral to Gastroenterology    Breast cancer screening        Relevant Orders    MM DIGITAL SCREENING BILATERAL    Vitamin D deficiency        Relevant Orders    VITAMIN D 25 Hydroxy (Vit-D Deficiency, Fractures) (Completed)    Hyperlipidemia        Relevant Orders    Lipid panel (Completed)    Weight gain        Relevant Orders    Comprehensive metabolic panel (Completed)    TSH (Completed)    Need for hepatitis C screening test        Relevant Orders    Hepatitis C antibody (Completed)    Cervical cancer screening        Relevant Orders    Cytology - PAP       I have discontinued Ms. Sudberry's sulfamethoxazole-trimethoprim and triamcinolone cream. I am also having her maintain her multivitamin, cholecalciferol, PREMPRO, and fluconazole.  Meds ordered this encounter  Medications  . fluconazole (DIFLUCAN) 200 MG tablet    Sig: Take 1 tablet by mouth once a week.    Refill:  2    Medications Discontinued During This  Encounter  Medication Reason  . sulfamethoxazole-trimethoprim (SEPTRA DS) 800-160 MG per tablet Completed Course  . triamcinolone cream (KENALOG) 0.1 % Completed Course  . sulfamethoxazole-trimethoprim (SEPTRA DS) 800-160 MG per tablet Completed Course    Follow-up: Return in about 1 year (around 05/27/2016).  Crecencio Mc, MD

## 2015-05-29 LAB — HEPATITIS C ANTIBODY: HCV Ab: NEGATIVE

## 2015-05-29 LAB — CYTOLOGY - PAP

## 2015-05-29 NOTE — Addendum Note (Signed)
Addended by: Crecencio Mc on: 05/29/2015 12:23 PM   Modules accepted: Miquel Dunn

## 2015-05-29 NOTE — Assessment & Plan Note (Signed)

## 2015-05-29 NOTE — Assessment & Plan Note (Addendum)
Based on current fasting cholesterol  And an LDL of 164,  10 year risk of having some type of coronary event (including heart attack) is 8% , meaning that one of  every 12 women with the same medical statistics will have a heart attack in the next 10 years.     The [employer] recommends starting patients on moderate intensity statin therapy for people with this risk  to lower your risks of these events. Will recommend that patient consider  starting simvastatin The alternative is to try dietary modification,  regular exercise and weight loss and repeat the lipids in 6 months, prior to your next visit .     Please let me know what you would like to do    Lab Results  Component Value Date   CHOL 252* 05/28/2015   HDL 68.10 05/28/2015   LDLCALC 164* 05/28/2015   LDLDIRECT 115.5 05/11/2013   TRIG 101.0 05/28/2015   CHOLHDL 4 05/28/2015

## 2015-05-29 NOTE — Assessment & Plan Note (Signed)
I have addressed  Her weight gain and recommended using a low glycemic index diet and regular exercise a minimum of 5 days per week.

## 2015-05-29 NOTE — Assessment & Plan Note (Addendum)
At last visit we discussed tapering her off of her hormone replacement therapy last winter but did not tolerate weaning. She has been on hormone replacement therapy prior to diagnosis of hyperlipidemia.05/29/2015.

## 2015-05-29 NOTE — Assessment & Plan Note (Signed)
She is overdue for 10 year follow up with Dr Vira Agar.

## 2015-05-30 ENCOUNTER — Encounter: Payer: Self-pay | Admitting: Internal Medicine

## 2015-06-01 ENCOUNTER — Encounter: Payer: Self-pay | Admitting: Internal Medicine

## 2015-06-13 NOTE — Telephone Encounter (Signed)
Mailed unread message to patient.  

## 2015-06-15 NOTE — Telephone Encounter (Signed)
Mailed unread message to patient.  

## 2015-06-20 ENCOUNTER — Telehealth: Payer: Self-pay | Admitting: Internal Medicine

## 2015-06-20 DIAGNOSIS — E785 Hyperlipidemia, unspecified: Secondary | ICD-10-CM

## 2015-06-20 DIAGNOSIS — Z79899 Other long term (current) drug therapy: Secondary | ICD-10-CM

## 2015-06-20 NOTE — Telephone Encounter (Signed)
I see that the OTC version was suggested in the [place] message that was mailed to the patient, I wanted to double check before I call her. Thanks.

## 2015-06-20 NOTE — Telephone Encounter (Signed)
Pt received letter with lab results and request to start Simvastatin. Pt stated that she would be okay to start rx and to call it in the [facility].

## 2015-06-21 MED ORDER — SIMVASTATIN 20 MG PO TABS: 20.0000 mg | ORAL_TABLET | Freq: Every day | ORAL | Status: DC

## 2015-06-21 NOTE — Telephone Encounter (Signed)
Simvastatin is not available OTC. It is a statin.  rx called in

## 2015-06-21 NOTE — Telephone Encounter (Signed)
If she is still taking fluconazole weekly,  She cannot start the simvastatin until she stops the fluconazole.

## 2015-06-21 NOTE — Telephone Encounter (Signed)
See unrouted response abut follow up labs

## 2015-06-21 NOTE — Addendum Note (Signed)
Addended by: Crecencio Mc on: 06/21/2015 02:23 PM   Modules accepted: Orders

## 2015-06-21 NOTE — Addendum Note (Signed)
Addended by: Crecencio Mc on: 06/21/2015 01:41 PM   Modules accepted: Orders

## 2015-06-21 NOTE — Telephone Encounter (Signed)
Spoke with the patient, she will stop the the Diflucan because she believes that this is a more important issue then the nail fungus at this time.  Thanks

## 2015-06-21 NOTE — Telephone Encounter (Signed)
See both prior messages.  rx sent before I realized somebody is prescribing fluconazole

## 2015-06-21 NOTE — Telephone Encounter (Signed)
Ok needs liver panel and fasting lipids repeated in 6 weeks

## 2015-06-22 NOTE — Telephone Encounter (Signed)
Can you assist in scheduling Labs as requested below.  thanks

## 2015-06-22 NOTE — Telephone Encounter (Signed)
Ok Pt is scheduled.  °

## 2015-06-26 ENCOUNTER — Other Ambulatory Visit: Payer: BLUE CROSS/BLUE SHIELD

## 2015-06-27 ENCOUNTER — Encounter: Payer: Self-pay | Admitting: Internal Medicine

## 2015-06-27 ENCOUNTER — Other Ambulatory Visit (INDEPENDENT_AMBULATORY_CARE_PROVIDER_SITE_OTHER): Payer: BLUE CROSS/BLUE SHIELD

## 2015-06-27 DIAGNOSIS — Z79899 Other long term (current) drug therapy: Secondary | ICD-10-CM | POA: Diagnosis not present

## 2015-06-27 DIAGNOSIS — E785 Hyperlipidemia, unspecified: Secondary | ICD-10-CM

## 2015-06-27 LAB — LIPID PANEL
Cholesterol: 181 mg/dL (ref 0–200)
HDL: 62.6 mg/dL (ref >39.00)
LDL CALC: 101 mg/dL — AB (ref 0–99)
NONHDL: 117.96
Total CHOL/HDL Ratio: 3
Triglycerides: 86 mg/dL (ref 0.0–149.0)
VLDL: 17.2 mg/dL (ref 0.0–40.0)

## 2015-06-27 LAB — COMPREHENSIVE METABOLIC PANEL
ALK PHOS: 59 U/L (ref 39–117)
ALT: 11 U/L (ref 0–35)
AST: 13 U/L (ref 0–37)
Albumin: 4.1 g/dL (ref 3.5–5.2)
BUN: 14 mg/dL (ref 6–23)
CHLORIDE: 106 meq/L (ref 96–112)
CO2: 30 mEq/L (ref 19–32)
Calcium: 9.3 mg/dL (ref 8.4–10.5)
Creatinine, Ser: 0.71 mg/dL (ref 0.40–1.20)
GFR: 87.97 mL/min (ref >60.00)
GLUCOSE: 115 mg/dL — AB (ref 70–99)
POTASSIUM: 4.6 meq/L (ref 3.5–5.1)
SODIUM: 142 meq/L (ref 135–145)
TOTAL PROTEIN: 6 g/dL (ref 6.0–8.3)
Total Bilirubin: 0.7 mg/dL (ref 0.2–1.2)

## 2015-06-28 ENCOUNTER — Encounter: Payer: Self-pay | Admitting: Internal Medicine

## 2015-07-17 ENCOUNTER — Ambulatory Visit
Admission: RE | Admit: 2015-07-17 | Discharge: 2015-07-17 | Disposition: A | Discharge status: Home / Self Care | Payer: BLUE CROSS/BLUE SHIELD | Source: Ambulatory Visit | Attending: Internal Medicine | Admitting: Internal Medicine

## 2015-07-17 DIAGNOSIS — Z1231 Encounter for screening mammogram for malignant neoplasm of breast: Secondary | ICD-10-CM | POA: Diagnosis not present

## 2015-07-17 DIAGNOSIS — Z1239 Encounter for other screening for malignant neoplasm of breast: Secondary | ICD-10-CM

## 2015-11-01 ENCOUNTER — Other Ambulatory Visit: Payer: Self-pay | Admitting: Internal Medicine

## 2016-01-02 ENCOUNTER — Other Ambulatory Visit: Payer: Self-pay | Admitting: Internal Medicine

## 2016-01-30 ENCOUNTER — Telehealth: Payer: Self-pay | Admitting: Internal Medicine

## 2016-01-30 MED ORDER — SIMVASTATIN 20 MG PO TABS: 20.0000 mg | ORAL_TABLET | Freq: Every day | ORAL | 1 refills | Status: DC

## 2016-01-30 NOTE — Telephone Encounter (Signed)
Pt left a vm needing a refill for simvastatin (ZOCOR) 20 MG tablet. Pt has an appt on 12/13.   Pharmacy is [facility], [address] [place]  Call pt @ [phone]. Thank you!

## 2016-01-30 NOTE — Telephone Encounter (Signed)
rx sent

## 2016-05-28 ENCOUNTER — Encounter: Payer: BLUE CROSS/BLUE SHIELD | Admitting: Internal Medicine

## 2016-06-30 ENCOUNTER — Ambulatory Visit (INDEPENDENT_AMBULATORY_CARE_PROVIDER_SITE_OTHER): Payer: BLUE CROSS/BLUE SHIELD | Admitting: Internal Medicine

## 2016-06-30 ENCOUNTER — Encounter: Payer: Self-pay | Admitting: Internal Medicine

## 2016-06-30 ENCOUNTER — Telehealth: Payer: Self-pay | Admitting: Internal Medicine

## 2016-06-30 VITALS — BP 118/68 | HR 72 | Temp 97.5°F | Resp 16 | Ht 65.75 in | Wt 182.1 lb

## 2016-06-30 DIAGNOSIS — E559 Vitamin D deficiency, unspecified: Secondary | ICD-10-CM

## 2016-06-30 DIAGNOSIS — Z1211 Encounter for screening for malignant neoplasm of colon: Secondary | ICD-10-CM

## 2016-06-30 DIAGNOSIS — R5383 Other fatigue: Secondary | ICD-10-CM

## 2016-06-30 DIAGNOSIS — E7800 Pure hypercholesterolemia, unspecified: Secondary | ICD-10-CM

## 2016-06-30 DIAGNOSIS — Z1231 Encounter for screening mammogram for malignant neoplasm of breast: Secondary | ICD-10-CM

## 2016-06-30 DIAGNOSIS — Z803 Family history of malignant neoplasm of breast: Secondary | ICD-10-CM | POA: Diagnosis not present

## 2016-06-30 DIAGNOSIS — C50912 Malignant neoplasm of unspecified site of left female breast: Secondary | ICD-10-CM

## 2016-06-30 DIAGNOSIS — Z634 Disappearance and death of family member: Secondary | ICD-10-CM

## 2016-06-30 DIAGNOSIS — E785 Hyperlipidemia, unspecified: Secondary | ICD-10-CM

## 2016-06-30 DIAGNOSIS — F4329 Adjustment disorder with other symptoms: Secondary | ICD-10-CM

## 2016-06-30 DIAGNOSIS — C773 Secondary and unspecified malignant neoplasm of axilla and upper limb lymph nodes: Secondary | ICD-10-CM | POA: Diagnosis not present

## 2016-06-30 DIAGNOSIS — Z0000 Encounter for general adult medical examination without abnormal findings: Secondary | ICD-10-CM | POA: Diagnosis not present

## 2016-06-30 DIAGNOSIS — Z1239 Encounter for other screening for malignant neoplasm of breast: Secondary | ICD-10-CM

## 2016-06-30 DIAGNOSIS — F4321 Adjustment disorder with depressed mood: Secondary | ICD-10-CM

## 2016-06-30 MED ORDER — DIAZEPAM 5 MG PO TABS
ORAL_TABLET | ORAL | 0 refills | Status: DC
Start: 2016-06-30 — End: 2018-03-22

## 2016-06-30 MED ORDER — ESCITALOPRAM OXALATE 10 MG PO TABS: 10.0000 mg | ORAL_TABLET | Freq: Every day | ORAL | 0 refills | Status: DC

## 2016-06-30 NOTE — Patient Instructions (Signed)
I want you to start taking Lexapro again,  Start with 1/2 tablet daily in the evening.  Increase to a full tablet after a week.  Take the valium ONLY IF YOU ARE HAVING TROUBLE SLEEPING  I ENCOURAGE YOU to return to grief counselling   I would like ot see you back in a month

## 2016-06-30 NOTE — Progress Notes (Signed)
Patient ID: Ashley Ward, female    DOB: December 19, 1950  Age: 66 y.o. MRN: [id number]  The patient is here for annual wellness examination and management of other chronic and acute problems.  Mammogram needed    The risk factors are reflected in the social history.  The roster of all physicians providing medical care to patient - is listed in the Snapshot section of the chart.  Activities of daily living:  The patient is 100% independent in all ADLs: dressing, toileting, feeding as well as independent mobility  Home safety : The patient has smoke detectors in the home. They wear seatbelts.  There are no firearms at home. There is no violence in the home.   There is no risks for hepatitis, STDs or HIV. There is no   history of blood transfusion. They have no travel history to infectious disease endemic areas of the world.  The patient has seen their dentist in the last six month. They have seen their eye doctor in the last year. They admit to slight hearing difficulty with regard to whispered voices and some television programs.  They have deferred audiologic testing in the last year.  They do not  have excessive sun exposure. Discussed the need for sun protection: hats, long sleeves and use of sunscreen if there is significant sun exposure.   Diet: the importance of a healthy diet is discussed. They do have a healthy diet.  The benefits of regular aerobic exercise were discussed. She walks 4 times per week ,  20 minutes.   Depression screen: there multiple signs and vegetative symptoms of complicated grief following the unexpected death of her grandson JJ 7 weeks ago.  She has been  Sleeping a lot,  Some days unable to get out of bed.  Crying uncontrollably,  At times unable to eat,  And unable to bond with her new granddaughter, who was born 37 days after her grandson died. Has been avoiding social contact and has limited her contact to husband and immediate family.   cognitive assessment: the  patient manages all their financial and personal affairs and is actively engaged. They could relate day,date,year and events; recalled 2/3 objects at 3 minutes; performed clock-face test normally.  The following portions of the patient's history were reviewed and updated as appropriate: allergies, current medications, past family history, past medical history,  past surgical history, past social history  and problem list.  Visual acuity was not assessed per patient preference since she has regular follow up with her ophthalmologist. Hearing and body mass index were assessed and reviewed.   During the course of the visit the patient was educated and counseled about appropriate screening and preventive services including : fall prevention , diabetes screening, nutrition counseling, colorectal cancer screening, and recommended immunizations.    CC: The primary encounter diagnosis was Breast cancer screening, high risk patient. Diagnoses of Fatigue, unspecified type, Vitamin D deficiency, Pure hypercholesterolemia, Encounter for preventive health examination, Hyperlipidemia LDL goal <160, Screening for colon cancer, Family history of breast cancer in first degree relative, and Complicated grief were also pertinent to this visit.  Complicated grief.  Yolanda Bonine (66 yrs old) died suddenly ,  7 weeks ago.  Distraught ,  Has not been able to do a day without sobbing.  Has not been able to socilalize ,  Spending most of time in [place] with daughter and new granddaughter but ot bonding with her yet Kellie Simmering was born 6 days after he died)   She  has a history of a prior trial of antidepressants which presented with a panic attack. Her former PCP Dr. Gorden Harms prescribed lexapro which she tolerated and took daily for 6 months   Sister was diagnosed with breast ca  age 76, which has now metastasized  To brain and colon .   History Gwynn has a past medical history of Hyperlipidemia; Menopause; and Menopause.    She has a past surgical history that includes Dilation and curettage, diagnostic / therapeutic (2007).   Her family history includes Alcohol abuse in her sister; Breast cancer (age of onset: 86) in her sister; COPD in her father; Cancer in her mother and sister; Diabetes in her mother.She reports that she has never smoked. She has never used smokeless tobacco. She reports that she drinks about 2.4 oz of alcohol per week . She reports that she does not use drugs.  Outpatient Medications Prior to Visit  Medication Sig Dispense Refill  . cholecalciferol (VITAMIN D) 1000 UNITS tablet Take 1,000 Units by mouth daily.    . Multiple Vitamin (MULTIVITAMIN) tablet Take 1 tablet by mouth daily.    Marland Kitchen PREMPRO 0.3-1.5 MG tablet USE AS PER PACKAGE INSTRUCTIONS TAKE 1 TABLET EVERY DAY 28 tablet 3  . simvastatin (ZOCOR) 20 MG tablet Take 1 tablet (20 mg total) by mouth at bedtime. 90 tablet 1  . fluconazole (DIFLUCAN) 200 MG tablet Take 1 tablet by mouth once a week.  2   No facility-administered medications prior to visit.     Review of Systems   Patient denies headache, fevers, malaise, unintentional weight loss, skin rash, eye pain, sinus congestion and sinus pain, sore throat, dysphagia,  hemoptysis , cough, dyspnea, wheezing, chest pain, palpitations, orthopnea, edema, abdominal pain, nausea, melena, diarrhea, constipation, flank pain, dysuria, hematuria, urinary  Frequency, nocturia, numbness, tingling, seizures,  Focal weakness, Loss of consciousness,  Tremor, and suicidal ideation.    Objective:  BP 118/68   Pulse 72   Temp 97.5 F (36.4 C) (Oral)   Resp 16   Ht 5' 5.75" (1.67 m)   Wt 182 lb 2 oz (82.6 kg)   SpO2 97%   BMI 29.62 kg/m   Physical Exam   General appearance: alert, cooperative and appears stated age Head: Normocephalic, without obvious abnormality, atraumatic Eyes: conjunctivae/corneas clear. PERRL, EOM's intact. Fundi benign. Ears: normal TM's and external ear canals  both ears Nose: Nares normal. Septum midline. Mucosa normal. No drainage or sinus tenderness. Throat: lips, mucosa, and tongue normal; teeth and gums normal Neck: no adenopathy, no carotid bruit, no JVD, supple, symmetrical, trachea midline and thyroid not enlarged, symmetric, no tenderness/mass/nodules Lungs: clear to auscultation bilaterally Breasts: normal appearance, no masses or tenderness Heart: regular rate and rhythm, S1, S2 normal, no murmur, click, rub or gallop Abdomen: soft, non-tender; bowel sounds normal; no masses,  no organomegaly Extremities: extremities normal, atraumatic, no cyanosis or edema Pulses: 2+ and symmetric Skin: Skin color, texture, turgor normal. No rashes or lesions Neurologic: Alert and oriented X 3, normal strength and tone. Normal symmetric reflexes. Normal coordination and gait.   Psych: affect  Tearful and grief stricken, makes good eye contact. No fidgeting, Denies suicidal thoughts .     Assessment & Plan:   Problem List Items Addressed This Visit    Complicated grief    Starting lexapro with rapid titration to 20 mg .  Follow up one month,  Advised to continue use of valium prn insomnia.       Encounter for  preventive health examination    Annual comprehensive preventive exam was done as well as an evaluation and management of chronic conditions .  During the course of the visit the patient was educated and counseled about appropriate screening and preventive services including :  diabetes screening, lipid analysis with projected  10 year  risk for CAD , nutrition counseling, breast, cervical and colorectal cancer screening, and recommended immunizations.  Printed recommendations for health maintenance screenings was given      Family history of breast cancer in first degree relative    Hersdister was diagnosed at age 45. Annual mammograms advised and ordered       Hyperlipidemia LDL goal <160    Managed with zocor. fasting labs  And hepatic  function panel are pending .  Lab Results  Component Value Date   ALT 11 06/27/2015   AST 13 06/27/2015   ALKPHOS 59 06/27/2015   BILITOT 0.7 06/27/2015         Screening for colon cancer    She is due for ten year follow up colonoscopy in Sept 2018       Other Visit Diagnoses    Breast cancer screening, high risk patient    -  Primary   Relevant Orders   MM SCREENING BREAST TOMO BILATERAL   Fatigue, unspecified type       Relevant Orders   Comprehensive metabolic panel   TSH   CBC with Differential/Platelet   Hepatitis C antibody (Completed)   Vitamin D deficiency       Relevant Orders   VITAMIN D 25 Hydroxy (Vit-D Deficiency, Fractures)   Pure hypercholesterolemia       Relevant Orders   Lipid panel     A total of 45 minutes was spent with patient more than half of which was spent in counseling patient on the above mentioned issues , and coordination of care. I have discontinued Ms. Bomar's fluconazole. I have also changed her diazepam. Additionally, I am having her start on escitalopram. Lastly, I am having her maintain her multivitamin, cholecalciferol, PREMPRO, and simvastatin.  Meds ordered this encounter  Medications  . DISCONTD: diazepam (VALIUM) 5 MG tablet    Sig: Take 5 mg by mouth every 6 (six) hours as needed for anxiety.  Marland Kitchen escitalopram (LEXAPRO) 10 MG tablet    Sig: Take 1 tablet (10 mg total) by mouth daily.    Dispense:  90 tablet    Refill:  0  . diazepam (VALIUM) 5 MG tablet    Sig: 1-2 tablets at bedtime if needed for insomnia    Dispense:  45 tablet    Refill:  0    Medications Discontinued During This Encounter  Medication Reason  . fluconazole (DIFLUCAN) 200 MG tablet Patient Discharge  . diazepam (VALIUM) 5 MG tablet Reorder    Follow-up: No Follow-up on file.   Crecencio Mc, MD

## 2016-06-30 NOTE — Progress Notes (Signed)
Pre-visit discussion using our clinic review tool. No additional management support is needed unless otherwise documented below in the visit note.  

## 2016-06-30 NOTE — Telephone Encounter (Signed)
Pt noticed upon check out that on her AVS it says that she has breast cancer. Pt does not her sister does. Please advise, thank you.

## 2016-07-01 DIAGNOSIS — F339 Major depressive disorder, recurrent, unspecified: Secondary | ICD-10-CM | POA: Insufficient documentation

## 2016-07-01 DIAGNOSIS — Z803 Family history of malignant neoplasm of breast: Secondary | ICD-10-CM | POA: Insufficient documentation

## 2016-07-01 LAB — COMPREHENSIVE METABOLIC PANEL
ALT: 14 U/L (ref 0–35)
AST: 15 U/L (ref 0–37)
Albumin: 4.4 g/dL (ref 3.5–5.2)
Alkaline Phosphatase: 55 U/L (ref 39–117)
BUN: 17 mg/dL (ref 6–23)
CHLORIDE: 107 meq/L (ref 96–112)
CO2: 28 meq/L (ref 19–32)
Calcium: 9.6 mg/dL (ref 8.4–10.5)
Creatinine, Ser: 0.73 mg/dL (ref 0.40–1.20)
GFR: 84.92 mL/min (ref >60.00)
GLUCOSE: 100 mg/dL — AB (ref 70–99)
POTASSIUM: 4.7 meq/L (ref 3.5–5.1)
Sodium: 143 mEq/L (ref 135–145)
Total Bilirubin: 0.5 mg/dL (ref 0.2–1.2)
Total Protein: 6.8 g/dL (ref 6.0–8.3)

## 2016-07-01 LAB — CBC WITH DIFFERENTIAL/PLATELET
BASOS PCT: 0.6 % (ref 0.0–3.0)
Basophils Absolute: 0 10*3/uL (ref 0.0–0.1)
EOS PCT: 1.8 % (ref 0.0–5.0)
Eosinophils Absolute: 0.1 10*3/uL (ref 0.0–0.7)
HEMATOCRIT: 42.1 % (ref 36.0–46.0)
HEMOGLOBIN: 14.1 g/dL (ref 12.0–15.0)
LYMPHS PCT: 36.8 % (ref 12.0–46.0)
Lymphs Abs: 2.5 10*3/uL (ref 0.7–4.0)
MCHC: 33.6 g/dL (ref 30.0–36.0)
MCV: 93.1 fl (ref 78.0–100.0)
MONOS PCT: 8 % (ref 3.0–12.0)
Monocytes Absolute: 0.5 10*3/uL (ref 0.1–1.0)
Neutro Abs: 3.5 10*3/uL (ref 1.4–7.7)
Neutrophils Relative %: 52.8 % (ref 43.0–77.0)
Platelets: 271 10*3/uL (ref 150.0–400.0)
RBC: 4.52 Mil/uL (ref 3.87–5.11)
RDW: 13.3 % (ref 11.5–15.5)
WBC: 6.7 10*3/uL (ref 4.0–10.5)

## 2016-07-01 LAB — LIPID PANEL
CHOL/HDL RATIO: 3
Cholesterol: 190 mg/dL (ref 0–200)
HDL: 66.2 mg/dL (ref >39.00)
LDL CALC: 107 mg/dL — AB (ref 0–99)
NonHDL: 124.13
Triglycerides: 85 mg/dL (ref 0.0–149.0)
VLDL: 17 mg/dL (ref 0.0–40.0)

## 2016-07-01 LAB — VITAMIN D 25 HYDROXY (VIT D DEFICIENCY, FRACTURES): VITD: 29.17 ng/mL — ABNORMAL LOW (ref 30.00–100.00)

## 2016-07-01 LAB — HEPATITIS C ANTIBODY: HCV AB: NEGATIVE

## 2016-07-01 LAB — TSH: TSH: 2.27 u[IU]/mL (ref 0.35–4.50)

## 2016-07-01 NOTE — Telephone Encounter (Signed)
Patient notified

## 2016-07-01 NOTE — Telephone Encounter (Signed)
I am aware of that. It came up while ordering her mammogram,  It is not a diagnosis , just a software problem I am trying to delete it from chart

## 2016-07-01 NOTE — Assessment & Plan Note (Addendum)
Managed with zocor. fasting labs  And hepatic function panel are pending .  Lab Results  Component Value Date   ALT 11 06/27/2015   AST 13 06/27/2015   ALKPHOS 59 06/27/2015   BILITOT 0.7 06/27/2015

## 2016-07-01 NOTE — Assessment & Plan Note (Signed)
Ashley Ward was diagnosed at age 65. Annual mammograms advised and ordered

## 2016-07-01 NOTE — Assessment & Plan Note (Signed)
She is due for ten year follow up colonoscopy in Sept 2018

## 2016-07-01 NOTE — Assessment & Plan Note (Signed)
Starting lexapro with rapid titration to 20 mg .  Follow up one month,  Advised to continue use of valium prn insomnia.

## 2016-07-01 NOTE — Assessment & Plan Note (Signed)
Annual comprehensive preventive exam was done as well as an evaluation and management of chronic conditions .  During the course of the visit the patient was educated and counseled about appropriate screening and preventive services including :  diabetes screening, lipid analysis with projected  10 year  risk for CAD , nutrition counseling, breast, cervical and colorectal cancer screening, and recommended immunizations.  Printed recommendations for health maintenance screenings was given 

## 2016-07-02 ENCOUNTER — Encounter: Payer: Self-pay | Admitting: Internal Medicine

## 2016-07-30 ENCOUNTER — Encounter: Payer: Self-pay | Admitting: Internal Medicine

## 2016-08-20 ENCOUNTER — Ambulatory Visit: Payer: BLUE CROSS/BLUE SHIELD

## 2016-10-02 ENCOUNTER — Ambulatory Visit: Payer: BLUE CROSS/BLUE SHIELD

## 2016-10-14 ENCOUNTER — Other Ambulatory Visit: Payer: Self-pay | Admitting: Internal Medicine

## 2016-10-14 MED ORDER — ESCITALOPRAM OXALATE 10 MG PO TABS: 10.0000 mg | ORAL_TABLET | Freq: Every day | ORAL | 0 refills | Status: DC

## 2016-10-14 NOTE — Telephone Encounter (Deleted)
Refilled: 06/30/2016 Last OV: 06/30/2016 Next OV: not scheduled

## 2016-10-15 ENCOUNTER — Other Ambulatory Visit: Payer: Self-pay | Admitting: Internal Medicine

## 2016-11-06 ENCOUNTER — Ambulatory Visit: Payer: BLUE CROSS/BLUE SHIELD

## 2016-11-24 ENCOUNTER — Ambulatory Visit
Admission: RE | Admit: 2016-11-24 | Discharge: 2016-11-24 | Disposition: A | Discharge status: Home / Self Care | Payer: BLUE CROSS/BLUE SHIELD | Source: Ambulatory Visit | Attending: Internal Medicine | Admitting: Internal Medicine

## 2016-11-24 DIAGNOSIS — Z803 Family history of malignant neoplasm of breast: Secondary | ICD-10-CM | POA: Insufficient documentation

## 2016-11-24 DIAGNOSIS — Z1231 Encounter for screening mammogram for malignant neoplasm of breast: Secondary | ICD-10-CM | POA: Insufficient documentation

## 2016-11-24 DIAGNOSIS — Z1239 Encounter for other screening for malignant neoplasm of breast: Secondary | ICD-10-CM

## 2016-11-28 ENCOUNTER — Other Ambulatory Visit: Payer: Self-pay | Admitting: Internal Medicine

## 2017-10-22 ENCOUNTER — Other Ambulatory Visit: Payer: Self-pay | Admitting: Internal Medicine

## 2017-10-23 NOTE — Telephone Encounter (Signed)
Refilled: 10/15/2016 Last OV: 06/30/2016 Next OV: not scheduled

## 2017-10-30 ENCOUNTER — Other Ambulatory Visit: Payer: Self-pay | Admitting: Internal Medicine

## 2017-12-16 ENCOUNTER — Ambulatory Visit: Payer: Self-pay | Admitting: Internal Medicine

## 2017-12-16 MED ORDER — SULFAMETHOXAZOLE-TRIMETHOPRIM 800-160 MG PO TABS: 1.0000 | ORAL_TABLET | Freq: Two times a day (BID) | ORAL | 0 refills | Status: DC

## 2017-12-16 NOTE — Telephone Encounter (Signed)
I have set pharmacy to the one at [place].

## 2017-12-16 NOTE — Telephone Encounter (Signed)
Pt c/o urinary sx: burning, heaviness and frequency that started 2 days ago. Sx started 2 days ago.  Pt wanting abx to be called in to [address]. Pt informed twice that she will need to go to a local  [facility] to be seen. Pt stated that "Dr. Derrel Nip knows me." Pt requesting call back  with response or abx called in.. Reason for Disposition . Urinating more frequently than usual (i.e., frequency)  Answer Assessment - Initial Assessment Questions 1. SYMPTOM: "What's the main symptom you're concerned about?" (e.g., frequency, incontinence)     Burning, heaviness, urgency and frequency 2. ONSET: "When did the  sx  start?"     2 days ago 3. PAIN: "Is there any pain?" If so, ask: "How bad is it?" (Scale: 1-10; mild, moderate, severe)     Burning with urination 4. CAUSE: "What do you think is causing the symptoms?"     UTI 5. OTHER SYMPTOMS: "Do you have any other symptoms?" (e.g., fever, flank pain, blood in urine, pain with urination)     Pain with urination 6. PREGNANCY: "Is there any chance you are pregnant?" "When was your last menstrual period?"     n/a  Protocols used: URINARY [facility]

## 2017-12-16 NOTE — Telephone Encounter (Signed)
Patient notified and voiced understanding she will make FU appointment soon as she is back in town.

## 2017-12-16 NOTE — Addendum Note (Signed)
Addended by: Crecencio Mc on: 12/16/2017 01:00 PM   Modules accepted: Orders

## 2017-12-16 NOTE — Telephone Encounter (Signed)
rx sent. Please let Ashley Ward know that this was done as a one time courtesy;  It is not my customary practice and will not be repeated.  She will also need to be reminded that she has not been seen since January of 2018

## 2018-01-28 ENCOUNTER — Other Ambulatory Visit: Payer: Self-pay | Admitting: Internal Medicine

## 2018-01-28 NOTE — Telephone Encounter (Signed)
Pt has sch her cpe sept 30th.  She is completely out of this med

## 2018-03-15 ENCOUNTER — Encounter: Payer: BLUE CROSS/BLUE SHIELD | Admitting: Internal Medicine

## 2018-03-16 ENCOUNTER — Telehealth: Payer: Self-pay

## 2018-03-16 NOTE — Telephone Encounter (Signed)
Spoke with pt and she stated that she is not having as much joint pain since taking just a half a tablet of the simvastatin. She stated that what she is having is tolerable. She stated that she is having trouble with depression issues again. She stated that her grandson passed away last year and she has been having depression issues on and off since then and then her [place] on Sunday so she is having to deal with that. Pt stated that she is happiest when she is with her other two grandchildren but she is sad very morning she wakes up. Pt has been scheduled for Monday at 11:30am.

## 2018-03-16 NOTE — Telephone Encounter (Signed)
Copied from [place] [id number]. Topic: General - Other >> Mar 16, 2018 11:15 AM Judyann Munson wrote: Reason for CRM: patient is calling to advise she has started to take half of simvastatin (ZOCOR) 20 MG tablet, she stated it has been a well that she has been doing this. She stated she is now concerned about joint pain that she has every now and again. She stated she is dealing with a lot and concerned about depression due to the passes of her grandson last year. She is requesting a call from a nurse. Please advise

## 2018-03-22 ENCOUNTER — Encounter: Payer: Self-pay | Admitting: Internal Medicine

## 2018-03-22 ENCOUNTER — Ambulatory Visit (INDEPENDENT_AMBULATORY_CARE_PROVIDER_SITE_OTHER): Payer: BLUE CROSS/BLUE SHIELD | Admitting: Internal Medicine

## 2018-03-22 VITALS — BP 100/76 | HR 71 | Temp 97.9°F | Resp 15 | Ht 65.75 in | Wt 186.2 lb

## 2018-03-22 DIAGNOSIS — E785 Hyperlipidemia, unspecified: Secondary | ICD-10-CM | POA: Diagnosis not present

## 2018-03-22 DIAGNOSIS — Z780 Asymptomatic menopausal state: Secondary | ICD-10-CM

## 2018-03-22 DIAGNOSIS — R5383 Other fatigue: Secondary | ICD-10-CM | POA: Diagnosis not present

## 2018-03-22 DIAGNOSIS — Z1211 Encounter for screening for malignant neoplasm of colon: Secondary | ICD-10-CM

## 2018-03-22 DIAGNOSIS — F339 Major depressive disorder, recurrent, unspecified: Secondary | ICD-10-CM

## 2018-03-22 DIAGNOSIS — Z1239 Encounter for other screening for malignant neoplasm of breast: Secondary | ICD-10-CM | POA: Diagnosis not present

## 2018-03-22 LAB — COMPREHENSIVE METABOLIC PANEL
ALBUMIN: 4.3 g/dL (ref 3.5–5.2)
ALT: 13 U/L (ref 0–35)
AST: 16 U/L (ref 0–37)
Alkaline Phosphatase: 48 U/L (ref 39–117)
BILIRUBIN TOTAL: 0.4 mg/dL (ref 0.2–1.2)
BUN: 16 mg/dL (ref 6–23)
CALCIUM: 9.2 mg/dL (ref 8.4–10.5)
CO2: 29 meq/L (ref 19–32)
Chloride: 105 mEq/L (ref 96–112)
Creatinine, Ser: 0.8 mg/dL (ref 0.40–1.20)
GFR: 76.01 mL/min (ref >60.00)
Glucose, Bld: 112 mg/dL — ABNORMAL HIGH (ref 70–99)
Potassium: 4.2 mEq/L (ref 3.5–5.1)
Sodium: 140 mEq/L (ref 135–145)
Total Protein: 6.9 g/dL (ref 6.0–8.3)

## 2018-03-22 LAB — LIPID PANEL
CHOLESTEROL: 205 mg/dL — AB (ref 0–200)
HDL: 57.3 mg/dL (ref >39.00)
LDL Cholesterol: 118 mg/dL — ABNORMAL HIGH (ref 0–99)
NonHDL: 148.04
Total CHOL/HDL Ratio: 4
Triglycerides: 148 mg/dL (ref 0.0–149.0)
VLDL: 29.6 mg/dL (ref 0.0–40.0)

## 2018-03-22 LAB — TSH: TSH: 2.13 u[IU]/mL (ref 0.35–4.50)

## 2018-03-22 LAB — HEMOGLOBIN A1C: Hgb A1c MFr Bld: 5.9 % (ref 4.6–6.5)

## 2018-03-22 MED ORDER — CONJ ESTROG-MEDROXYPROGEST ACE 0.3-1.5 MG PO TABS: 1.0000 | ORAL_TABLET | Freq: Every day | ORAL | 0 refills | Status: DC

## 2018-03-22 MED ORDER — BUPROPION HCL ER (XL) 150 MG PO TB24: 150.0000 mg | ORAL_TABLET | Freq: Every day | ORAL | 1 refills | Status: DC

## 2018-03-22 NOTE — Patient Instructions (Addendum)
We are Starting  wellbutrin for depression .  Take it once daily  with breakfast   After 2 weeks,  If tolerating the medication but still no improvement  In the depression symptoms , you can Increase dose to 2 tablets in the morning and LET ME KNOW   I want you to walk for 15 minutes every day  OUTSIDE , preferably in the morning , or after a big meal   Increase the length of your weekly in increments of 5 minutes until you get to 30 minutes  daily    Start taking Vitamin D3  2000 ius daily   I recommend getting the half of your calcium and Vitamin D  Requirements through dietary sources rather than supplements given the recent association of calcium supplements with increased coronary artery calcium scores (You need 1800 mg daily )   Unsweetened almond/coconut milk, Cashew and soy  Milks are all great low calorie low carb, cholesterol free sources of dietary calcium and vitamin D.    Try taking famotidine 20 mg before any spicy meal  Food Choices for Gastroesophageal Reflux Disease, Adult When you have gastroesophageal reflux disease (GERD), the foods you eat and your eating habits are very important. Choosing the right foods can help ease the discomfort of GERD. Consider working with a diet and nutrition specialist (dietitian) to help you make healthy food choices. What general guidelines should I follow? Eating plan  Choose healthy foods low in fat, such as fruits, vegetables, whole grains, low-fat dairy products, and lean meat, fish, and poultry.  Eat frequent, small meals instead of three large meals each day. Eat your meals slowly, in a relaxed setting. Avoid bending over or lying down until 2-3 hours after eating.  Limit high-fat foods such as fatty meats or fried foods.  Limit your intake of oils, butter, and shortening to less than 8 teaspoons each day.  Avoid the following: ? Foods that cause symptoms. These may be different for different people. Keep a food diary to keep  track of foods that cause symptoms. ? Alcohol. ? Drinking large amounts of liquid with meals. ? Eating meals during the 2-3 hours before bed.  Cook foods using methods other than frying. This may include baking, grilling, or broiling. Lifestyle   Maintain a healthy weight. Ask your health care provider what weight is healthy for you. If you need to lose weight, work with your health care provider to do so safely.  Exercise for at least 30 minutes on 5 or more days each week, or as told by your health care provider.  Avoid wearing clothes that fit tightly around your waist and chest.  Do not use any products that contain nicotine or tobacco, such as cigarettes and e-cigarettes. If you need help quitting, ask your health care provider.  Sleep with the head of your bed raised. Use a wedge under the mattress or blocks under the bed frame to raise the head of the bed. What foods are not recommended? The items listed may not be a complete list. Talk with your dietitian about what dietary choices are best for you. Grains Pastries or quick breads with added fat. [place] toast. Vegetables Deep fried vegetables. [place] fries. Any vegetables prepared with added fat. Any vegetables that cause symptoms. For some people this may include tomatoes and tomato products, chili peppers, onions and garlic, and horseradish. Fruits Any fruits prepared with added fat. Any fruits that cause symptoms. For some people this may include citrus  fruits, such as oranges, grapefruit, pineapple, and lemons. Meats and other protein foods High-fat meats, such as fatty beef or pork, hot dogs, ribs, ham, sausage, salami and bacon. Fried meat or protein, including fried fish and fried chicken. Nuts and nut butters. Dairy Whole milk and chocolate milk. Sour cream. Cream. Ice cream. Cream cheese. Milk shakes. Beverages Coffee and tea, with or without caffeine. Carbonated beverages. Sodas. Energy drinks. Fruit juice made with  acidic fruits (such as orange or grapefruit). Tomato juice. Alcoholic drinks. Fats and oils Butter. Margarine. Shortening. Ghee. Sweets and desserts Chocolate and cocoa. Donuts. Seasoning and other foods Pepper. Peppermint and spearmint. Any condiments, herbs, or seasonings that cause symptoms. For some people, this may include curry, hot sauce, or vinegar-based salad dressings. Summary  When you have gastroesophageal reflux disease (GERD), food and lifestyle choices are very important to help ease the discomfort of GERD.  Eat frequent, small meals instead of three large meals each day. Eat your meals slowly, in a relaxed setting. Avoid bending over or lying down until 2-3 hours after eating.  Limit high-fat foods such as fatty meat or fried foods. This information is not intended to replace advice given to you by your health care provider. Make sure you discuss any questions you have with your health care provider. Document Released: 06/02/2005 Document Revised: 06/03/2016 Document Reviewed: 06/03/2016 Elsevier Interactive Patient Education  [employer].
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
## 2018-03-23 NOTE — Assessment & Plan Note (Signed)
Previously treated with simvastatin,  Lost to follow up.  Most recent dose 10 mg simvastatin stopped last week,  And patient reports joint and muscle pain have improved with suspension. Current risk is low at 4%.  We will repeat in 6 months  Lab Results  Component Value Date   CHOL 205 (H) 03/22/2018   HDL 57.30 03/22/2018   LDLCALC 118 (H) 03/22/2018   LDLDIRECT 115.5 05/11/2013   TRIG 148.0 03/22/2018   CHOLHDL 4 03/22/2018

## 2018-03-23 NOTE — Assessment & Plan Note (Signed)
Anhedonia, fatigue, hypersomnolence, overeating and tearfulness.  Trial of wellbutrin 150 mg daily .  Encouraged to participate in grief counselling with family pastor .  rtc one month

## 2018-04-19 ENCOUNTER — Encounter: Payer: Self-pay | Admitting: Internal Medicine

## 2018-04-19 ENCOUNTER — Ambulatory Visit (INDEPENDENT_AMBULATORY_CARE_PROVIDER_SITE_OTHER): Payer: BLUE CROSS/BLUE SHIELD | Admitting: Internal Medicine

## 2018-04-19 VITALS — BP 114/68 | HR 74 | Temp 97.7°F | Resp 15 | Ht 65.75 in | Wt 186.4 lb

## 2018-04-19 DIAGNOSIS — Z803 Family history of malignant neoplasm of breast: Secondary | ICD-10-CM | POA: Diagnosis not present

## 2018-04-19 DIAGNOSIS — E782 Mixed hyperlipidemia: Secondary | ICD-10-CM

## 2018-04-19 DIAGNOSIS — Z1231 Encounter for screening mammogram for malignant neoplasm of breast: Secondary | ICD-10-CM | POA: Diagnosis not present

## 2018-04-19 DIAGNOSIS — F339 Major depressive disorder, recurrent, unspecified: Secondary | ICD-10-CM

## 2018-04-19 DIAGNOSIS — Z1211 Encounter for screening for malignant neoplasm of colon: Secondary | ICD-10-CM

## 2018-04-19 DIAGNOSIS — E785 Hyperlipidemia, unspecified: Secondary | ICD-10-CM | POA: Diagnosis not present

## 2018-04-19 DIAGNOSIS — Z0000 Encounter for general adult medical examination without abnormal findings: Secondary | ICD-10-CM | POA: Diagnosis not present

## 2018-04-19 MED ORDER — ZOSTER VAC RECOMB ADJUVANTED 50 MCG/0.5ML IM SUSR: 0.5000 mL | Freq: Once | INTRAMUSCULAR | 1 refills | Status: AC

## 2018-04-19 NOTE — Patient Instructions (Addendum)
.ttmam   You are at risk for developing type 2 diabetes based on your recent A1c of 5.9  You can delay this progression with regular exercise and a low glycemic index diet  You can reduce your carbohydrates by limiting your starches or finding alternatives (see below):   Try the SOLA low carb bread ([employer]  Frozen bread )  3 g/slice  Tastes great!   Try spaghetti squash for pasta.   To make a low carb chip :  Take the Joseph's Lavash or Pita bread,  Or the [place] Low carb whole wheat tortilla   Place on metal cookie sheet  Brush with olive oil  Sprinkle garlic powder (NOT garlic salt), grated parmesan cheese, mediterranean seasoning , or all of them?  Bake at 275 for 30 minutes   We have substitutions for your potatoes!!  Try the mashed cauliflower and riced cauliflower dishes instead of rice and mashed potatoes  Mashed turnips are also very low carb!   For desserts :  Try the Dannon Lt n Fit greek yogurt dessert flavors and top with reddi Whip .  8 carbs,  80 calories  Try Oikos Triple Zero [place] Yogurt in the salted caramel, and the coffee flavors  With Whipped Cream for dessert  breyer's low carb ice cream, available in bars (on a stick, better ) or scoopable ice cream  HERE ARE THE LOW CARB  BREAD CHOICES       Your annual mammogram has been ordered.  You are encouraged (required) to call to make your appointment at [facility]   The ShingRx vaccine is now available in local pharmacies and is much more protective thant Zostavaxs,  It is therefore ADVISED for all interested adults over 50 to prevent shingles    We are repeating your cholesterol panel today.    Preventive Care 103 Years and Older, Female Preventive care refers to lifestyle choices and visits with your health care provider that can promote health and wellness. What does preventive care include?  A yearly physical exam. This is also called an annual well check.  Dental  exams once or twice a year.  Routine eye exams. Ask your health care provider how often you should have your eyes checked.  Personal lifestyle choices, including: ? Daily care of your teeth and gums. ? Regular physical activity. ? Eating a healthy diet. ? Avoiding tobacco and drug use. ? Limiting alcohol use. ? Practicing safe sex. ? Taking low-dose aspirin every day. ? Taking vitamin and mineral supplements as recommended by your health care provider. What happens during an annual well check? The services and screenings done by your health care provider during your annual well check will depend on your age, overall health, lifestyle risk factors, and family history of disease. Counseling Your health care provider may ask you questions about your:  Alcohol use.  Tobacco use.  Drug use.  Emotional well-being.  Home and relationship well-being.  Sexual activity.  Eating habits.  History of falls.  Memory and ability to understand (cognition).  Work and work Statistician.  Reproductive health.  Screening You may have the following tests or measurements:  Height, weight, and BMI.  Blood pressure.  Lipid and cholesterol levels. These may be checked every 5 years, or more frequently if you are over 66 years old.  Skin check.  Lung cancer screening. You may have this screening every year starting at age 74 if you have a 30-pack-year history of smoking and  currently smoke or have quit within the past 15 years.  Fecal occult blood test (FOBT) of the stool. You may have this test every year starting at age 63.  Flexible sigmoidoscopy or colonoscopy. You may have a sigmoidoscopy every 5 years or a colonoscopy every 10 years starting at age 65.  Hepatitis C blood test.  Hepatitis B blood test.  Sexually transmitted disease (STD) testing.  Diabetes screening. This is done by checking your blood sugar (glucose) after you have not eaten for a while (fasting). You may  have this done every 1-3 years.  Bone density scan. This is done to screen for osteoporosis. You may have this done starting at age 71.  Mammogram. This may be done every 1-2 years. Talk to your health care provider about how often you should have regular mammograms.  Talk with your health care provider about your test results, treatment options, and if necessary, the need for more tests. Vaccines Your health care provider may recommend certain vaccines, such as:  Influenza vaccine. This is recommended every year.  Tetanus, diphtheria, and acellular pertussis (Tdap, Td) vaccine. You may need a Td booster every 10 years.  Varicella vaccine. You may need this if you have not been vaccinated.  Zoster vaccine. You may need this after age 22.  Measles, mumps, and rubella (MMR) vaccine. You may need at least one dose of MMR if you were born in 1957 or later. You may also need a second dose.  Pneumococcal 13-valent conjugate (PCV13) vaccine. One dose is recommended after age 17.  Pneumococcal polysaccharide (PPSV23) vaccine. One dose is recommended after age 57.  Meningococcal vaccine. You may need this if you have certain conditions.  Hepatitis A vaccine. You may need this if you have certain conditions or if you travel or work in places where you may be exposed to hepatitis A.  Hepatitis B vaccine. You may need this if you have certain conditions or if you travel or work in places where you may be exposed to hepatitis B.  Haemophilus influenzae type b (Hib) vaccine. You may need this if you have certain conditions.  Talk to your health care provider about which screenings and vaccines you need and how often you need them. This information is not intended to replace advice given to you by your health care provider. Make sure you discuss any questions you have with your health care provider. Document Released: 06/29/2015 Document Revised: 02/20/2016 Document Reviewed: 04/03/2015 Elsevier  Interactive Patient Education  [employer].

## 2018-04-19 NOTE — Progress Notes (Signed)
Patient ID: Ashley Ward, female    DOB: 04-21-1951  Age: 67 y.o. MRN: [id number]  The patient is here for annual preventive examination and management of other chronic and acute problems.  Colonoscopy overdue BY ONE YEAR,  NORMAL IN 2008,  NEGATIVE HEMOCCULT IN  2017   HAS APPT TOMORROW MAMMOGRAM DUE last one June 2018  DONE AT [facility]   The risk factors are reflected in the social history.  The roster of all physicians providing medical care to patient - is listed in the Snapshot section of the chart.  Activities of daily living:  The patient is 100% independent in all ADLs: dressing, toileting, feeding as well as independent mobility  Home safety : The patient has smoke detectors in the home. They wear seatbelts.  There are no firearms at home. There is no violence in the home.   There is no risks for hepatitis, STDs or HIV. There is no   history of blood transfusion. They have no travel history to infectious disease endemic areas of the world.  The patient has seen their dentist in the last six month. They have seen their eye doctor in the last year. They admit to slight hearing difficulty with regard to whispered voices and some television programs.  They have deferred audiologic testing in the last year.  They do not  have excessive sun exposure. Discussed the need for sun protection: hats, long sleeves and use of sunscreen if there is significant sun exposure.   Diet: the importance of a healthy diet is discussed. They do have a healthy diet.  The benefits of regular aerobic exercise were discussed. She walks 4 times per week ,  20 minutes.   Depression screen: there are no signs or vegative symptoms of depression- irritability, change in appetite, anhedonia, sadness/tearfullness.  Cognitive assessment: the patient manages all their financial and personal affairs and is actively engaged. They could relate day,date,year and events; recalled 2/3 objects at 3 minutes; performed  clock-face test normally.  The following portions of the patient's history were reviewed and updated as appropriate: allergies, current medications, past family history, past medical history,  past surgical history, past social history  and problem list.  Visual acuity was not assessed per patient preference since she has regular follow up with her ophthalmologist. Hearing and body mass index were assessed and reviewed.   During the course of the visit the patient was educated and counseled about appropriate screening and preventive services including : fall prevention , diabetes screening, nutrition counseling, colorectal cancer screening, and recommended immunizations.    CC: The primary encounter diagnosis was Visit for screening mammogram. Diagnoses of Moderate mixed hyperlipidemia not requiring statin therapy, Encounter for preventive health examination, Family history of breast cancer in first degree relative, Major depressive disorder, recurrent episode with melancholic features ([place]), Hyperlipidemia LDL goal <160, and Colon cancer screening were also pertinent to this visit.  Insomnia managed with watching TV   Wakes up at  4 am on the couch sometimes.  Hard to go back to sleep,  Averages at leatst 6 hours   Usually  sleeps unti l  Am    Energy level better.  Muscles hurting less since stopping cholesterol.   Ff 6 weeks now.     History Ashley Ward has a past medical history of Hyperlipidemia, Menopause, and Menopause.   She has a past surgical history that includes Dilation and curettage, diagnostic / therapeutic (2007).   Her family history includes Alcohol abuse in  her sister; Breast cancer (age of onset: 39) in her sister; COPD in her father; Cancer in her mother and sister; Diabetes in her mother.She reports that she has never smoked. She has never used smokeless tobacco. She reports that she drinks about 4.0 standard drinks of alcohol per week. She reports that she does not use  drugs.  Outpatient Medications Prior to Visit  Medication Sig Dispense Refill  . buPROPion (WELLBUTRIN XL) 150 MG 24 hr tablet Take 1 tablet (150 mg total) by mouth daily. With breakfast 90 tablet 1  . estrogen, conjugated,-medroxyprogesterone (PREMPRO) 0.3-1.5 MG tablet Take 1 tablet by mouth daily. 84 tablet 0   No facility-administered medications prior to visit.     Review of Systems   Patient denies headache, fevers, malaise, unintentional weight loss, skin rash, eye pain, sinus congestion and sinus pain, sore throat, dysphagia,  hemoptysis , cough, dyspnea, wheezing, chest pain, palpitations, orthopnea, edema, abdominal pain, nausea, melena, diarrhea, constipation, flank pain, dysuria, hematuria, urinary  Frequency, nocturia, numbness, tingling, seizures,  Focal weakness, Loss of consciousness,  Tremor, insomnia, depression, anxiety, and suicidal ideation.      Objective:  BP 114/68 (BP Location: Left Arm, Patient Position: Sitting, Cuff Size: Normal)   Pulse 74   Temp 97.7 F (36.5 C) (Oral)   Resp 15   Ht 5' 5.75" (1.67 m)   Wt 186 lb 6.4 oz (84.6 kg)   SpO2 97%   BMI 30.31 kg/m   Physical Exam   General appearance: alert, cooperative and appears stated age Head: Normocephalic, without obvious abnormality, atraumatic Eyes: conjunctivae/corneas clear. PERRL, EOM's intact. Fundi benign. Ears: normal TM's and external ear canals both ears Nose: Nares normal. Septum midline. Mucosa normal. No drainage or sinus tenderness. Throat: lips, mucosa, and tongue normal; teeth and gums normal Neck: no adenopathy, no carotid bruit, no JVD, supple, symmetrical, trachea midline and thyroid not enlarged, symmetric, no tenderness/mass/nodules Lungs: clear to auscultation bilaterally Breasts: normal appearance, no masses or tenderness Heart: regular rate and rhythm, S1, S2 normal, no murmur, click, rub or gallop Abdomen: soft, non-tender; bowel sounds normal; no masses,  no  organomegaly Extremities: extremities normal, atraumatic, no cyanosis or edema Pulses: 2+ and symmetric Skin: Skin color, texture, turgor normal. No rashes or lesions Neurologic: Alert and oriented X 3, normal strength and tone. Normal symmetric reflexes. Normal coordination and gait.      Assessment & Plan:   Problem List Items Addressed This Visit    Encounter for preventive health examination    Annual comprehensive preventive exam was done as well as an evaluation and management of chronic conditions .  During the course of the visit the patient was educated and counseled about appropriate screening and preventive services including :  diabetes screening, lipid analysis with projected  10 year  risk for CAD , nutrition counseling, breast, cervical and colorectal cancer screening, and recommended immunizations.  Printed recommendations for health maintenance screenings was given      Family history of breast cancer in first degree relative    3d mammogram normal.      Hyperlipidemia LDL goal <160    Previously treated with simvastatin,  Stopped due to  joint and muscle pain  Which have resolved  with suspension. Current risk is low at 7%.   No treatment indicated.  Lab Results  Component Value Date   CHOL 234 (H) 04/19/2018   HDL 61.60 04/19/2018   LDLCALC 133 (H) 04/19/2018   LDLDIRECT 115.5 05/11/2013   TRIG  195.0 (H) 04/19/2018   CHOLHDL 4 04/19/2018         Major depressive disorder, recurrent episode with melancholic features ([place])    Improved with trial of wellbutrin 150 mg daily .  Encouraged to participate in grief counselling with family pastor .         Other Visit Diagnoses    Visit for screening mammogram    -  Primary   Relevant Orders   MM 3D SCREEN BREAST BILATERAL   Moderate mixed hyperlipidemia not requiring statin therapy       Relevant Orders   Lipid panel (Completed)   Colon cancer screening       Relevant Orders   Ambulatory referral to  Gastroenterology      I am having Ashley Ward start on Zoster Vaccine Adjuvanted. I am also having her maintain her buPROPion and estrogen (conjugated)-medroxyprogesterone.  Meds ordered this encounter  Medications  . Zoster Vaccine Adjuvanted [facility]) injection    Sig: Inject 0.5 mLs into the muscle once for 1 dose.    Dispense:  1 each    Refill:  1    There are no discontinued medications.  Follow-up: No follow-ups on file.   Crecencio Mc, MD

## 2018-04-20 LAB — LIPID PANEL
CHOLESTEROL: 234 mg/dL — AB (ref 0–200)
HDL: 61.6 mg/dL (ref >39.00)
LDL Cholesterol: 133 mg/dL — ABNORMAL HIGH (ref 0–99)
NonHDL: 172
TRIGLYCERIDES: 195 mg/dL — AB (ref 0.0–149.0)
Total CHOL/HDL Ratio: 4
VLDL: 39 mg/dL (ref 0.0–40.0)

## 2018-04-20 NOTE — Assessment & Plan Note (Signed)
Previously treated with simvastatin,  Stopped due to  joint and muscle pain  Which have resolved  with suspension. Current risk is low at 7%.   No treatment indicated.  Lab Results  Component Value Date   CHOL 234 (H) 04/19/2018   HDL 61.60 04/19/2018   LDLCALC 133 (H) 04/19/2018   LDLDIRECT 115.5 05/11/2013   TRIG 195.0 (H) 04/19/2018   CHOLHDL 4 04/19/2018

## 2018-04-20 NOTE — Assessment & Plan Note (Signed)
3d mammogram normal.

## 2018-04-20 NOTE — Assessment & Plan Note (Signed)
Improved with trial of wellbutrin 150 mg daily .  Encouraged to participate in grief counselling with family pastor .

## 2018-04-20 NOTE — Assessment & Plan Note (Signed)
Annual comprehensive preventive exam was done as well as an evaluation and management of chronic conditions .  During the course of the visit the patient was educated and counseled about appropriate screening and preventive services including :  diabetes screening, lipid analysis with projected  10 year  risk for CAD , nutrition counseling, breast, cervical and colorectal cancer screening, and recommended immunizations.  Printed recommendations for health maintenance screenings was given 

## 2018-04-21 ENCOUNTER — Other Ambulatory Visit: Payer: Self-pay | Admitting: Internal Medicine

## 2018-04-21 DIAGNOSIS — E785 Hyperlipidemia, unspecified: Secondary | ICD-10-CM

## 2018-04-21 NOTE — Progress Notes (Signed)
lipid

## 2018-04-29 ENCOUNTER — Other Ambulatory Visit: Payer: Self-pay | Admitting: Internal Medicine

## 2018-04-29 MED ORDER — BUPROPION HCL ER (XL) 300 MG PO TB24: 300.0000 mg | ORAL_TABLET | Freq: Every day | ORAL | 2 refills | Status: DC

## 2018-05-02 ENCOUNTER — Telehealth: Payer: BLUE CROSS/BLUE SHIELD | Admitting: Physician Assistant

## 2018-05-02 DIAGNOSIS — R300 Dysuria: Secondary | ICD-10-CM

## 2018-05-02 MED ORDER — SULFAMETHOXAZOLE-TRIMETHOPRIM 800-160 MG PO TABS: 1.0000 | ORAL_TABLET | Freq: Two times a day (BID) | ORAL | 0 refills | Status: DC

## 2018-05-02 NOTE — Progress Notes (Signed)
We are sorry that you are not feeling well.  Here is how we plan to help!  Based on what you shared with me it looks like you most likely have a simple urinary tract infection.  A UTI (Urinary Tract Infection) is a bacterial infection of the bladder.  Most cases of urinary tract infections are simple to treat but a key part of your care is to encourage you to drink plenty of fluids and watch your symptoms carefully.  I have prescribed an antibiotic, Bactrim, that I see you have taken and tolerated well before for UTI. Your symptoms should gradually improve. Call us if the burning in your urine worsens, you develop worsening fever, back pain or pelvic pain or if your symptoms do not resolve after completing the antibiotic.  Urinary tract infections can be prevented by drinking plenty of water to keep your body hydrated.  Also be sure when you wipe, wipe from front to back and don't hold it in!  If possible, empty your bladder every 4 hours.  Your e-visit answers were reviewed by a board certified advanced clinical practitioner to complete your personal care plan.  Depending on the condition, your plan could have included both over the counter or prescription medications.  If there is a problem please reply  once you have received a response from your provider.  Your safety is important to [place].  If you have drug allergies check your prescription carefully.    You can use MyChart to ask questions about today's visit, request a non-urgent call back, or ask for a work or school excuse for 24 hours related to this e-Visit. If it has been greater than 24 hours you will need to follow up with your provider, or enter a new e-Visit to address those concerns.   You will get an e-mail in the next two days asking about your experience.  I hope that your e-visit has been valuable and will speed your recovery. Thank you for using e-visits.

## 2018-06-24 ENCOUNTER — Ambulatory Visit
Admission: RE | Admit: 2018-06-24 | Discharge: 2018-06-24 | Disposition: A | Discharge status: Home / Self Care | Payer: BLUE CROSS/BLUE SHIELD | Source: Ambulatory Visit | Attending: Internal Medicine | Admitting: Internal Medicine

## 2018-06-24 ENCOUNTER — Other Ambulatory Visit: Payer: Self-pay | Admitting: Internal Medicine

## 2018-06-24 DIAGNOSIS — R928 Other abnormal and inconclusive findings on diagnostic imaging of breast: Secondary | ICD-10-CM

## 2018-06-24 DIAGNOSIS — Z780 Asymptomatic menopausal state: Secondary | ICD-10-CM | POA: Diagnosis present

## 2018-06-24 DIAGNOSIS — Z1231 Encounter for screening mammogram for malignant neoplasm of breast: Secondary | ICD-10-CM

## 2018-06-24 DIAGNOSIS — N6310 Unspecified lump in the right breast, unspecified quadrant: Secondary | ICD-10-CM

## 2018-06-28 NOTE — Telephone Encounter (Signed)
Copied from [place] [id number]. Topic: General - Other >> Jun 28, 2018  1:53 PM Yvette Rack wrote: Reason for CRM: pt calling about message that she sent to DR Derrel Nip on 06-26-18 through mychart she states that she need a U/S on rt breast due to abnormal reading please call pt at [phone]

## 2018-06-30 ENCOUNTER — Ambulatory Visit
Admission: RE | Admit: 2018-06-30 | Discharge: 2018-06-30 | Disposition: A | Discharge status: Home / Self Care | Payer: BLUE CROSS/BLUE SHIELD | Source: Ambulatory Visit | Attending: Internal Medicine | Admitting: Internal Medicine

## 2018-06-30 DIAGNOSIS — N6310 Unspecified lump in the right breast, unspecified quadrant: Secondary | ICD-10-CM

## 2018-06-30 DIAGNOSIS — R928 Other abnormal and inconclusive findings on diagnostic imaging of breast: Secondary | ICD-10-CM

## 2018-08-24 ENCOUNTER — Other Ambulatory Visit: Payer: Self-pay | Admitting: Internal Medicine

## 2018-10-28 ENCOUNTER — Other Ambulatory Visit: Payer: Self-pay | Admitting: Internal Medicine

## 2019-01-26 ENCOUNTER — Other Ambulatory Visit: Payer: Self-pay | Admitting: Internal Medicine

## 2019-03-09 ENCOUNTER — Ambulatory Visit (INDEPENDENT_AMBULATORY_CARE_PROVIDER_SITE_OTHER): Payer: BC Managed Care – PPO

## 2019-03-09 ENCOUNTER — Other Ambulatory Visit: Payer: Self-pay

## 2019-03-09 DIAGNOSIS — Z0000 Encounter for general adult medical examination without abnormal findings: Secondary | ICD-10-CM | POA: Diagnosis not present

## 2019-03-09 NOTE — Progress Notes (Signed)
Subjective:   Ashley Ward is a 68 y.o. female who presents for an Initial Medicare Annual Wellness Visit.  Review of Systems    No ROS.  Medicare Wellness Virtual Visit.  Visual/audio telehealth visit, UTA vital signs.   See social history for additional risk factors.    Cardiac Risk Factors include: advanced age (>69mn, >>78women)     Objective:    Today's Vitals   There is no height or weight on file to calculate BMI.  Advanced Directives 03/09/2019  Does Patient Have a Medical Advance Directive? No  Would patient like information on creating a medical advance directive? Yes (MAU/Ambulatory/Procedural Areas - Information given)    Current Medications (verified) Outpatient Encounter Medications as of 03/09/2019  Medication Sig  . buPROPion (WELLBUTRIN XL) 300 MG 24 hr tablet TAKE ONE TABLET BY MOUTH EVERY DAY WITH BREAKFAST  . Multiple Vitamins-Minerals (PRESERVISION AREDS PO) Take 2 capsules by mouth daily.  .Marland KitchenPREMPRO 0.3-1.5 MG tablet TAKE  1 TABLET BY MOUTH DAILY  . [DISCONTINUED] sulfamethoxazole-trimethoprim (BACTRIM DS,SEPTRA DS) 800-160 MG tablet Take 1 tablet by mouth 2 (two) times daily.   No facility-administered encounter medications on file as of 03/09/2019.     Allergies (verified) Penicillins and Asa [aspirin]   History: Past Medical History:  Diagnosis Date  . Hyperlipidemia   . Menopause   . Menopause    Past Surgical History:  Procedure Laterality Date  . DILATION AND CURETTAGE, DIAGNOSTIC / THERAPEUTIC  2007   Kincius,  for spotting, fresolved   Family History  Problem Relation Age of Onset  . Diabetes Mother   . Cancer Mother        lymphoma., in remission  . COPD Father   . Cancer Sister        half sister , BRCA, negative genetic testing  . Alcohol abuse Sister   . Breast cancer Sister 461 . Breast cancer Cousin    Social History   Socioeconomic History  . Marital status: Married    Spouse name: Not on file  . Number of  children: Not on file  . Years of education: Not on file  . Highest education level: Not on file  Occupational History  . Not on file  Social Needs  . Financial resource strain: Not hard at all  . Food insecurity    Worry: Never true    Inability: Never true  . Transportation needs    Medical: No    Non-medical: No  Tobacco Use  . Smoking status: Never Smoker  . Smokeless tobacco: Never Used  Substance and Sexual Activity  . Alcohol use: Yes    Alcohol/week: 4.0 standard drinks    Types: 4 Glasses of wine per week  . Drug use: No  . Sexual activity: Yes  Lifestyle  . Physical activity    Days per week: 3 days    Minutes per session: 30 min  . Stress: Not at all  Relationships  . Social [place] phone: Not on file    Gets together: Not on file    Attends religious service: Not on file    Active member of club or organization: Not on file    Attends meetings of clubs or organizations: Not on file    Relationship status: Not on file  Other Topics Concern  . Not on file  Social History Narrative  . Not on file    Tobacco Counseling Counseling given: Not Answered  Clinical Intake:  Pre-visit preparation completed: Yes        Diabetes: No  How often do you need to have someone help you when you read instructions, pamphlets, or other written materials from your doctor or pharmacy?: 1 - Never  Interpreter Needed?: No      Activities of Daily Living In your present state of health, do you have any difficulty performing the following activities: 03/09/2019  Hearing? N  Vision? N  Difficulty concentrating or making decisions? N  Walking or climbing stairs? N  Dressing or bathing? N  Doing errands, shopping? N  Preparing Food and eating ? N  Using the Toilet? N  In the past six months, have you accidently leaked urine? N  Do you have problems with loss of bowel control? N  Managing your Medications? N  Managing your Finances? N   Housekeeping or managing your Housekeeping? N  Some recent data might be hidden     Immunizations and Health Maintenance Immunization History  Administered Date(s) Administered  . Influenza Split 03/20/2014, 03/28/2015, 03/26/2016  . Influenza-Unspecified 03/16/2013, 03/18/2018  . Pneumococcal Conjugate-13 03/26/2016  . Tdap 04/26/2012, 03/20/2014   There are no preventive care reminders to display for this patient.  Patient Care Team: Crecencio Mc, MD as PCP - General (Internal Medicine)  Indicate any recent Medical Services you may have received from other than [facility] providers in the past year (date may be approximate).     Assessment:   This is a routine wellness examination for Jaaliyah.  I connected with patient 03/09/19 at  9:00 AM EDT by an audio enabled telemedicine application and verified that I am speaking with the correct person using two identifiers. Patient stated full name and DOB. Patient gave permission to continue with virtual visit. Patient's location was at home and Nurse's location was at [place] office.   Health Maintenance Due: -Influenza vaccine 2020- discussed; to be completed in season with doctor or local pharmacy.   -PNA - discussed; to be completed with doctor in visit or local pharmacy.  Update all pending maintenance due as appropriate.   See completed HM at the end of note.   Eye: Visual acuity not assessed. Virtual visit. Wears corrective lenses. Followed by their ophthalmologist every 12 months. Taking areds BID due to early signs of macular degeneration.   Dental: Visits every 6 months.    Hearing: Demonstrates normal hearing during visit.  Safety:  Patient feels safe at home- yes Patient does have smoke detectors at home- yes Patient does wear sunscreen or protective clothing when in direct sunlight - yes Patient does wear seat belt when in a moving vehicle - yes Patient drives- yes Adequate lighting in walkways free from debris-  yes Grab bars and handrails used as appropriate- yes Ambulates with no assistive device Cell phone on person when ambulating outside of the home- yes  Social: Alcohol intake - yes      Smoking history- never   Smokers in home? none Illicit drug use? none  Depression: PHQ 2 &9 complete. See screening below.  Stable with medication. Not sleeping well. Declines follow up with pcp before scheduled cpe.  Falls: See screening below.    Medication: Taking as directed and without issues.   Covid-19: Precautions and sickness symptoms discussed. Wears mask, social distancing, hand hygiene as appropriate.   Activities of Daily Living Patient denies needing assistance with: household chores, feeding themselves, getting from bed to chair, getting to the toilet, bathing/showering, dressing, managing  money, or preparing meals.   Memory: Patient is alert. Patient denies difficulty focusing or concentrating. Correctly identified the president of the [place], season and recall.  BMI- discussed the importance of a healthy diet, water intake and the benefits of aerobic exercise.  Educational material provided.  Physical activity- walking 3 days, 30 minutes  Diet: Regular  Fluids: good intake  Advanced Directive: End of life planning; Advance aging; Advanced directives discussed.  Copy of current HCPOA/Living Will requested upon completion.    Other Providers Patient Care Team: Crecencio Mc, MD as PCP - General (Internal Medicine)  Hearing/Vision screen  Hearing Screening   125Hz  250Hz  500Hz  1000Hz  2000Hz  3000Hz  4000Hz  6000Hz  8000Hz   Right ear:           Left ear:           Comments: Patient is able to hear conversational tones without difficulty.  No issues reported.  Vision Screening Comments: Wears corrective lenses Visual acuity not assessed, virtual visit.  They have seen their ophthalmologist in the last 12 months.     Dietary issues and exercise activities discussed:  Current Exercise Habits: Home exercise routine, Type of exercise: walking, Time (Minutes): 30, Frequency (Times/Week): 3, Weekly Exercise (Minutes/Week): 90, Intensity: Mild  Goals      Patient Stated   . Increase physical activity (pt-stated)     I want to walk 5 days weekly, 30 minutes I want to have more of a social interaction routine with friends     . Weight (lb) < 186 lb (84.4 kg) (pt-stated)     I want to start weight watchers program Lose 15lb       Depression Screen PHQ 2/9 Scores 03/09/2019 03/22/2018 06/30/2016 04/27/2012  PHQ - 2 Score 1 4 6  0  PHQ- 9 Score 4 18 14  -    Fall Risk Fall Risk  03/09/2019 03/22/2018  Falls in the past year? 0 No  Number falls in past yr: 0 -  Injury with Fall? 0 -   Timed Get Up and Go Performed no, virtual visit  Cognitive Function:     6CIT Screen 03/09/2019  What Year? 0 points  What month? 0 points  What time? 0 points  Count back from 20 0 points  Months in reverse 0 points  Repeat phrase 0 points  Total Score 0    Screening Tests Health Maintenance  Topic Date Due  . INFLUENZA VACCINE  09/14/2019 (Originally 01/15/2019)  . PNA vac Low Risk Adult (2 of 2 - PPSV23) 03/08/2020 (Originally 03/26/2017)  . MAMMOGRAM  06/24/2020  . TETANUS/TDAP  03/20/2024  . COLONOSCOPY  07/01/2026  . DEXA SCAN  Completed  . Hepatitis C Screening  Completed      Plan:   Keep all routine maintenance appointments.   Cpe 04/25/19 @ 830  Medicare Attestation I have personally reviewed: The patient's medical and social history Their use of alcohol, tobacco or illicit drugs Their current medications and supplements The patient's functional ability including ADLs,fall risks, home safety risks, cognitive, and hearing and visual impairment Diet and physical activities Evidence for depression   In addition, I have reviewed and discussed with patient certain preventive protocols, quality metrics, and best practice recommendations. A written  personalized care plan for preventive services as well as general preventive health recommendations were provided to patient via mail.     Varney Biles, LPN   5/00/3704

## 2019-03-09 NOTE — Patient Instructions (Addendum)
  Ms. Haydel , Thank you for taking time to come for your Medicare Wellness Visit. I appreciate your ongoing commitment to your health goals. Please review the following plan we discussed and let me know if I can assist you in the future.   These are the goals we discussed: Goals      Patient Stated   . Increase physical activity (pt-stated)     I want to walk 5 days weekly, 30 minutes I want to have more of a social interaction routine with friends     . Weight (lb) < 186 lb (84.4 kg) (pt-stated)     I want to start weight watchers program Lose 15lb        This is a list of the screening recommended for you and due dates:  Health Maintenance  Topic Date Due  . Flu Shot  09/14/2019*  . Pneumonia vaccines (2 of 2 - PPSV23) 03/08/2020*  . Mammogram  06/24/2020  . Tetanus Vaccine  03/20/2024  . Colon Cancer Screening  07/01/2026  . DEXA scan (bone density measurement)  Completed  .  Hepatitis C: One time screening is recommended by Center for Disease Control  (CDC) for  adults born from [birth year] through 1965.   Completed  *Topic was postponed. The date shown is not the original due date.

## 2019-03-12 ENCOUNTER — Encounter: Payer: Self-pay | Admitting: Internal Medicine

## 2019-03-29 ENCOUNTER — Other Ambulatory Visit: Payer: Self-pay | Admitting: Internal Medicine

## 2019-04-01 ENCOUNTER — Other Ambulatory Visit: Payer: Self-pay

## 2019-04-01 ENCOUNTER — Other Ambulatory Visit (INDEPENDENT_AMBULATORY_CARE_PROVIDER_SITE_OTHER): Payer: BC Managed Care – PPO

## 2019-04-01 ENCOUNTER — Encounter: Payer: Self-pay | Admitting: Internal Medicine

## 2019-04-01 ENCOUNTER — Ambulatory Visit (INDEPENDENT_AMBULATORY_CARE_PROVIDER_SITE_OTHER): Payer: BC Managed Care – PPO | Admitting: Internal Medicine

## 2019-04-01 DIAGNOSIS — F339 Major depressive disorder, recurrent, unspecified: Secondary | ICD-10-CM | POA: Diagnosis not present

## 2019-04-01 DIAGNOSIS — N3090 Cystitis, unspecified without hematuria: Secondary | ICD-10-CM | POA: Diagnosis not present

## 2019-04-01 DIAGNOSIS — F5102 Adjustment insomnia: Secondary | ICD-10-CM

## 2019-04-01 DIAGNOSIS — R300 Dysuria: Secondary | ICD-10-CM | POA: Diagnosis not present

## 2019-04-01 LAB — POCT URINALYSIS DIPSTICK
Glucose, UA: NEGATIVE
Ketones, UA: NEGATIVE
Leukocytes, UA: NEGATIVE
Nitrite, UA: NEGATIVE
Protein, UA: POSITIVE — AB
Spec Grav, UA: >=1.03 — AB (ref 1.010–1.025)
Urobilinogen, UA: 0.2 E.U./dL
pH, UA: 5.5 (ref 5.0–8.0)

## 2019-04-01 LAB — URINALYSIS, MICROSCOPIC ONLY

## 2019-04-01 MED ORDER — SULFAMETHOXAZOLE-TRIMETHOPRIM 800-160 MG PO TABS: 1.0000 | ORAL_TABLET | Freq: Two times a day (BID) | ORAL | 0 refills | Status: DC

## 2019-04-01 MED ORDER — BUPROPION HCL ER (XL) 300 MG PO TB24: 300.0000 mg | ORAL_TABLET | Freq: Every day | ORAL | 1 refills | Status: DC

## 2019-04-01 MED ORDER — BUPROPION HCL ER (XL) 150 MG PO TB24: 150.0000 mg | ORAL_TABLET | Freq: Every day | ORAL | 1 refills | Status: DC

## 2019-04-01 MED ORDER — TRAZODONE HCL 50 MG PO TABS: 25.0000 mg | ORAL_TABLET | Freq: Every evening | ORAL | 3 refills | Status: DC | PRN

## 2019-04-01 NOTE — Assessment & Plan Note (Signed)
She has been feeling more depressed for the last several weeks.  She has increased her wellbutrin XL dose from 300 mg to 600 mg at times with improvement in symptoms.  Advised her to increase dose to 450 mg daily for now and follow up in one month (appt prescheduled)

## 2019-04-01 NOTE — Patient Instructions (Signed)
.  I am treating  you for a  Urinary tract infection  With  Septra DS .  please take it twice daily for 5 days  You can use OTC AZO for the burning,  It will make your urine orange colored.   Please take a probiotic ( Align, Flora que or Culturelle) for 2 weeks if you start the antibiotic to prevent a serious antibiotic associated diarrhea  Called" clostridium difficile colitis" ( should also help prevent   vaginal yeast infection)   I am increasing your Wellbutrin to 450 mg daily for your depression   You'll need take 2 tablets ( one is 150 mg and the other is 300 mg ) daily in the morning   I am prescribing trazodone to help you sleep.  Take 1/2 tablet 1 one hour before bedtime.  Increase dose eery 3 days , if needed,  To achieve sleep.  If not sleeping at 100 mg dose let me know

## 2019-04-01 NOTE — Assessment & Plan Note (Signed)
Empiric treatment with Septra DS pending urinalysis and culture

## 2019-04-01 NOTE — Progress Notes (Signed)
Virtual Visit via [email or web address]  This visit type was conducted due to national recommendations for restrictions regarding the COVID-19 pandemic (e.g. social distancing).  This format is felt to be most appropriate for this patient at this time.  All issues noted in this document were discussed and addressed.  No physical exam was performed (except for noted visual exam findings with Video Visits).   I connected with@ on 04/01/19 at 10:00 AM EDT by a video enabled telemedicine application  and verified that I am speaking with the correct person using two identifiers. Location patient: home Location provider: work or home office Persons participating in the virtual visit: patient, provider  I discussed the limitations, risks, security and privacy concerns of performing an evaluation and management service by telephone and the availability of in person appointments. I also discussed with the patient that there may be a patient responsible charge related to this service. The patient expressed understanding and agreed to proceed.   Reason for visit: UTI symptoms ,  Worsening depression   HPI:  68 yr old female with history of depression triggered by the untimely loss of her grandson at age 54 , presents with :   1) dysuria and suprapubic pressure x 24 hours. No back pain, nausea or fevers   2) worsening depression.  Tearful a lot .  COVID restrictions and the thought of the holidays  without her grandson difficult to bear.  Feels betyter when she doubles the dose of wellbutrin.  Not sleeping well at all.  Appetite ok, not losing weight.    ROS: See pertinent positives and negatives per HPI.  Past Medical History:  Diagnosis Date  . Hyperlipidemia   . Menopause   . Menopause     Past Surgical History:  Procedure Laterality Date  . DILATION AND CURETTAGE, DIAGNOSTIC / THERAPEUTIC  2007   Kincius,  for spotting, fresolved    Family History  Problem Relation Age of Onset  . Diabetes Mother    . Cancer Mother        lymphoma., in remission  . COPD Father   . Cancer Sister        half sister , BRCA, negative genetic testing  . Alcohol abuse Sister   . Breast cancer Sister 55  . Breast cancer Cousin     SOCIAL HX:  reports that she has never smoked. She has never used smokeless tobacco. She reports current alcohol use of about 4.0 standard drinks of alcohol per week. She reports that she does not use drugs.   Current Outpatient Medications:  .  buPROPion (WELLBUTRIN XL) 300 MG 24 hr tablet, TAKE ONE TABLET BY MOUTH EVERY DAY WITH BREAKFAST, Disp: 90 tablet, Rfl: 1 .  Multiple Vitamins-Minerals (PRESERVISION AREDS PO), Take 2 capsules by mouth daily., Disp: , Rfl:  .  PREMPRO 0.3-1.5 MG tablet, TAKE 1 TABLET BY MOUTH DAILY, Disp: 84 tablet, Rfl: 0 .  buPROPion (WELLBUTRIN XL) 150 MG 24 hr tablet, Take 1 tablet (150 mg total) by mouth daily. Take with 300 mg tablet total daily dose 450 mg, Disp: 90 tablet, Rfl: 1 .  buPROPion (WELLBUTRIN XL) 300 MG 24 hr tablet, Take 1 tablet (300 mg total) by mouth daily. With 150 mg .  Total daily dose 450 mg, Disp: 90 tablet, Rfl: 1 .  sulfamethoxazole-trimethoprim (BACTRIM DS) 800-160 MG tablet, Take 1 tablet by mouth 2 (two) times daily., Disp: 10 tablet, Rfl: 0 .  traZODone (DESYREL) 50 MG tablet, Take 0.5-1 tablets (25-50  mg total) by mouth at bedtime as needed for sleep. Increase as needed max dose 100 mg daily, Disp: 90 tablet, Rfl: 3  EXAM:  VITALS per patient if applicable:  GENERAL: alert, oriented, appears well and in no acute distress  HEENT: atraumatic, conjunttiva clear, no obvious abnormalities on inspection of external nose and ears  NECK: normal movements of the head and neck  LUNGS: on inspection no signs of respiratory distress, breathing rate appears normal, no obvious gross SOB, gasping or wheezing  CV: no obvious cyanosis  MS: moves all visible extremities without noticeable abnormality  PSYCH/NEURO: pleasant and  cooperative, tearful when speaking about her deceased grandchild, , speech and thought processing grossly intact  ASSESSMENT AND PLAN:  Discussed the following assessment and plan:  Dysuria - Plan: POCT Urinalysis Dipstick, Urine Culture, Urine Microscopic Only  Cystitis  Major depressive disorder, recurrent episode with melancholic features (HCC)  Insomnia due to psychological stress  Cystitis Empiric treatment with Septra DS pending urinalysis and culture  Major depressive disorder, recurrent episode with melancholic features ([place]) She has been feeling more depressed for the last several weeks.  She has increased her wellbutrin XL dose from 300 mg to 600 mg at times with improvement in symptoms.  Advised her to increase dose to 450 mg daily for now and follow up in one month (appt prescheduled)  Insomnia due to psychological stress Adding trazodone starting dose 25 mg daily,  Max dose 100 mg     I discussed the assessment and treatment plan with the patient. The patient was provided an opportunity to ask questions and all were answered. The patient agreed with the plan and demonstrated an understanding of the instructions.   The patient was advised to call back or seek an in-person evaluation if the symptoms worsen or if the condition fails to improve as anticipated.  I provided  25 minutes of non-face-to-face time during this encounter reviewing patient's current problems and post surgeries.  Providing counseling on the above mentioned problems , and coordination  of care .  Crecencio Mc, MD

## 2019-04-01 NOTE — Assessment & Plan Note (Signed)
Adding trazodone starting dose 25 mg daily,  Max dose 100 mg

## 2019-04-03 LAB — URINE CULTURE
MICRO NUMBER:: 998363
SPECIMEN QUALITY:: ADEQUATE

## 2019-04-25 ENCOUNTER — Encounter: Payer: BLUE CROSS/BLUE SHIELD | Admitting: Internal Medicine

## 2019-05-02 ENCOUNTER — Other Ambulatory Visit: Payer: Self-pay

## 2019-05-04 ENCOUNTER — Ambulatory Visit (INDEPENDENT_AMBULATORY_CARE_PROVIDER_SITE_OTHER): Payer: BC Managed Care – PPO | Admitting: Internal Medicine

## 2019-05-04 ENCOUNTER — Encounter: Payer: Self-pay | Admitting: Internal Medicine

## 2019-05-04 ENCOUNTER — Other Ambulatory Visit: Payer: Self-pay

## 2019-05-04 VITALS — BP 116/72 | HR 75 | Temp 96.6°F | Resp 14 | Ht 65.75 in | Wt 186.0 lb

## 2019-05-04 DIAGNOSIS — Z1231 Encounter for screening mammogram for malignant neoplasm of breast: Secondary | ICD-10-CM

## 2019-05-04 DIAGNOSIS — R5383 Other fatigue: Secondary | ICD-10-CM | POA: Diagnosis not present

## 2019-05-04 DIAGNOSIS — R7303 Prediabetes: Secondary | ICD-10-CM | POA: Diagnosis not present

## 2019-05-04 DIAGNOSIS — Z0000 Encounter for general adult medical examination without abnormal findings: Secondary | ICD-10-CM

## 2019-05-04 MED ORDER — ZOSTER VAC RECOMB ADJUVANTED 50 MCG/0.5ML IM SUSR: 0.5000 mL | Freq: Once | INTRAMUSCULAR | 1 refills | Status: AC

## 2019-05-04 NOTE — Patient Instructions (Signed)
Your annual mammogram is due in January 2021 and has been ordered.  You are encouraged (required) to call to make your appointment at [facility]  The ShingRx vaccine is now available in local pharmacies and is much more protective than the old one  Zostavax  (it is about 97%  Effective in preventing shingles). .   It is therefore ADVISED for all interested adults over 50 to prevent shingles so I have printed you a prescription for it.  (it requires a 2nd dose 2 to 6 months after the first one) .  It will cause you to have flu  like symptoms for 2 days If your pharmacy is not available to give it to you,  The [facility] pharmacy will give it to you.  They are located on the ground floor of the [facility] After age 3, you are at a higher risk for certain long-term diseases and infections as well as injuries from falls. Falls are a major cause of broken bones and head injuries in people who are older than age 21. Getting regular preventive care can help to keep you healthy and well. Preventive care includes getting regular testing and making lifestyle changes as recommended by your health care provider. Talk with your health care provider about:  Which screenings and tests you should have. A screening is a test that checks for a disease when you have no symptoms.  A diet and exercise plan that is right for you. What should I know about screenings and tests to prevent falls? Screening and testing are the best ways to find a health problem early. Early diagnosis and treatment give you the best chance of managing medical conditions that are common after age 31. Certain conditions and lifestyle choices may make you more likely to have a fall. Your health care provider may recommend:  Regular vision checks. Poor vision and conditions such as cataracts can make you more likely to have a fall. If you wear glasses, make sure to get your  prescription updated if your vision changes.  Medicine review. Work with your health care provider to regularly review all of the medicines you are taking, including over-the-counter medicines. Ask your health care provider about any side effects that may make you more likely to have a fall. Tell your health care provider if any medicines that you take make you feel dizzy or sleepy.  Osteoporosis screening. Osteoporosis is a condition that causes the bones to get weaker. This can make the bones weak and cause them to break more easily.  Blood pressure screening. Blood pressure changes and medicines to control blood pressure can make you feel dizzy.  Strength and balance checks. Your health care provider may recommend certain tests to check your strength and balance while standing, walking, or changing positions.  Foot health exam. Foot pain and numbness, as well as not wearing proper footwear, can make you more likely to have a fall.  Depression screening. You may be more likely to have a fall if you have a fear of falling, feel emotionally low, or feel unable to do activities that you used to do.  Alcohol use screening. Using too much alcohol can affect your balance and may make you more likely to have a fall. What actions can I take to lower my risk of falls? General instructions  Talk with your health care provider about your risks for falling. Tell your health  care provider if: ? You fall. Be sure to tell your health care provider about all falls, even ones that seem minor. ? You feel dizzy, sleepy, or off-balance.  Take over-the-counter and prescription medicines only as told by your health care provider. These include any supplements.  Eat a healthy diet and maintain a healthy weight. A healthy diet includes low-fat dairy products, low-fat (lean) meats, and fiber from whole grains, beans, and lots of fruits and vegetables. Home safety  Remove any tripping hazards, such as rugs,  cords, and clutter.  Install safety equipment such as grab bars in bathrooms and safety rails on stairs.  Keep rooms and walkways well-lit. Activity   Follow a regular exercise program to stay fit. This will help you maintain your balance. Ask your health care provider what types of exercise are appropriate for you.  If you need a cane or walker, use it as recommended by your health care provider.  Wear supportive shoes that have nonskid soles. Lifestyle  Do not drink alcohol if your health care provider tells you not to drink.  If you drink alcohol, limit how much you have: ? 0-1 drink a day for women. ? 0-2 drinks a day for men.  Be aware of how much alcohol is in your drink. In the [place] one drink equals one typical bottle of beer (12 oz), one-half glass of wine (5 oz), or one shot of hard liquor (1 oz).  Do not use any products that contain nicotine or tobacco, such as cigarettes and e-cigarettes. If you need help quitting, ask your health care provider. Summary  Having a healthy lifestyle and getting preventive care can help to protect your health and wellness after age 20.  Screening and testing are the best way to find a health problem early and help you avoid having a fall. Early diagnosis and treatment give you the best chance for managing medical conditions that are more common for people who are older than age 22.  Falls are a major cause of broken bones and head injuries in people who are older than age 68. Take precautions to prevent a fall at home.  Work with your health care provider to learn what changes you can make to improve your health and wellness and to prevent falls. This information is not intended to replace advice given to you by your health care provider. Make sure you discuss any questions you have with your health care provider. Document Released: 04/15/2017 Document Revised: 09/23/2018 Document Reviewed: 04/15/2017 Elsevier Patient Education  2020  [employer].

## 2019-05-04 NOTE — Progress Notes (Signed)
Patient ID: Ashley Ward, female    DOB: 1950-06-30  Age: 68 y.o. MRN: [id number]  The patient is here for annual preventive  examination and management of other chronic and acute problems.  This visit occurred during the SARS-CoV-2 public health emergency.  Safety protocols were in place, including screening questions prior to the visit, additional usage of staff PPE, and extensive cleaning of exam room while observing appropriate contact time as indicated for disinfecting solutions.    Mammogram due jan 2021  colonoscopy 2018, ten year follow up   The risk factors are reflected in the social history.  The roster of all physicians providing medical care to patient - is listed in the Snapshot section of the chart.  Activities of daily living:  The patient is 100% independent in all ADLs: dressing, toileting, feeding as well as independent mobility  Home safety : The patient has smoke detectors in the home. They wear seatbelts.  There are no firearms at home. There is no violence in the home.   There is no risks for hepatitis, STDs or HIV. There is no   history of blood transfusion. They have no travel history to infectious disease endemic areas of the world.  The patient has seen their dentist in the last six month. They have seen their eye doctor in the last year. They admit to slight hearing difficulty with regard to whispered voices and some television programs.  They have deferred audiologic testing in the last year.  They do not  have excessive sun exposure. Discussed the need for sun protection: hats, long sleeves and use of sunscreen if there is significant sun exposure.   Diet: the importance of a healthy diet is discussed. They do have a healthy diet.  The benefits of regular aerobic exercise were discussed. She walks 4 times per week ,  20 minutes.   Depression screen: there are no signs or vegative symptoms of depression- irritability, change in appetite, anhedonia,  sadness/tearfullness.  Cognitive assessment: the patient manages all their financial and personal affairs and is actively engaged. They could relate day,date,year and events; recalled 2/3 objects at 3 minutes; performed clock-face test normally.  The following portions of the patient's history were reviewed and updated as appropriate: allergies, current medications, past family history, past medical history,  past surgical history, past social history  and problem list.  Visual acuity was not assessed per patient preference since she has regular follow up with her ophthalmologist. Hearing and body mass index were assessed and reviewed.   During the course of the visit the patient was educated and counseled about appropriate screening and preventive services including : fall prevention , diabetes screening, nutrition counseling, colorectal cancer screening, and recommended immunizations.    CC: The primary encounter diagnosis was Prediabetes. Diagnoses of Fatigue, unspecified type, Encounter for screening mammogram for breast cancer, and Encounter for preventive health examination were also pertinent to this visit.  History [name] has a past medical history of Hyperlipidemia, Menopause, and Menopause.   She has a past surgical history that includes Dilation and curettage, diagnostic / therapeutic (2007).   Her family history includes Alcohol abuse in her sister; Breast cancer in her cousin; Breast cancer (age of onset: 89) in her sister; COPD in her father; Cancer in her mother and sister; Diabetes in her mother.She reports that she has never smoked. She has never used smokeless tobacco. She reports current alcohol use of about 4.0 standard drinks of alcohol per week. She reports that  she does not use drugs.  Outpatient Medications Prior to Visit  Medication Sig Dispense Refill  . buPROPion (WELLBUTRIN XL) 150 MG 24 hr tablet Take 1 tablet (150 mg total) by mouth daily. Take with 300 mg tablet  total daily dose 450 mg 90 tablet 1  . buPROPion (WELLBUTRIN XL) 300 MG 24 hr tablet Take 1 tablet (300 mg total) by mouth daily. With 150 mg .  Total daily dose 450 mg 90 tablet 1  . Multiple Vitamins-Minerals (PRESERVISION AREDS PO) Take 2 capsules by mouth daily.    Marland Kitchen PREMPRO 0.3-1.5 MG tablet TAKE 1 TABLET BY MOUTH DAILY 84 tablet 0  . traZODone (DESYREL) 50 MG tablet Take 0.5-1 tablets (25-50 mg total) by mouth at bedtime as needed for sleep. Increase as needed max dose 100 mg daily 90 tablet 3  . sulfamethoxazole-trimethoprim (BACTRIM DS) 800-160 MG tablet Take 1 tablet by mouth 2 (two) times daily. 10 tablet 0  . buPROPion (WELLBUTRIN XL) 300 MG 24 hr tablet TAKE ONE TABLET BY MOUTH EVERY DAY WITH BREAKFAST (Patient not taking: Reported on 05/04/2019) 90 tablet 1   No facility-administered medications prior to visit.     Review of Systems   Patient denies headache, fevers, malaise, unintentional weight loss, skin rash, eye pain, sinus congestion and sinus pain, sore throat, dysphagia,  hemoptysis , cough, dyspnea, wheezing, chest pain, palpitations, orthopnea, edema, abdominal pain, nausea, melena, diarrhea, constipation, flank pain, dysuria, hematuria, urinary  Frequency, nocturia, numbness, tingling, seizures,  Focal weakness, Loss of consciousness,  Tremor, insomnia, depression, anxiety, and suicidal ideation.     Objective:  BP 116/72 (BP Location: Left Arm, Patient Position: Sitting, Cuff Size: Normal)   Pulse 75   Temp (!) 96.6 F (35.9 C) (Temporal)   Resp 14   Ht 5' 5.75" (1.67 m)   Wt 186 lb (84.4 kg)   SpO2 97%   BMI 30.25 kg/m   Physical Exam   General appearance: alert, cooperative and appears stated age Head: Normocephalic, without obvious abnormality, atraumatic Eyes: conjunctivae/corneas clear. PERRL, EOM's intact. Fundi benign. Ears: normal TM's and external ear canals both ears Nose: Nares normal. Septum midline. Mucosa normal. No drainage or sinus  tenderness. Throat: lips, mucosa, and tongue normal; teeth and gums normal Neck: no adenopathy, no carotid bruit, no JVD, supple, symmetrical, trachea midline and thyroid not enlarged, symmetric, no tenderness/mass/nodules Lungs: clear to auscultation bilaterally Breasts: normal appearance, no masses or tenderness Heart: regular rate and rhythm, S1, S2 normal, no murmur, click, rub or gallop Abdomen: soft, non-tender; bowel sounds normal; no masses,  no organomegaly Extremities: extremities normal, atraumatic, no cyanosis or edema Pulses: 2+ and symmetric Skin: Skin color, texture, turgor normal. No rashes or lesions Neurologic: Alert and oriented X 3, normal strength and tone. Normal symmetric reflexes. Normal coordination and gait.      Assessment & Plan:   Problem List Items Addressed This Visit      Unprioritized   Encounter for preventive health examination    age appropriate education and counseling updated, referrals for preventative services and immunizations addressed, dietary and smoking counseling addressed, most recent labs reviewed.  I have personally reviewed and have noted:  1) the patient's medical and social history 2) The pt's use of alcohol, tobacco, and illicit drugs 3) The patient's current medications and supplements 4) Functional ability including ADL's, fall risk, home safety risk, hearing and visual impairment 5) Diet and physical activities 6) Evidence for depression or mood disorder 7) The patient's height,  weight, and BMI have been recorded in the chart  I have made referrals, and provided counseling and education based on review of the above      Prediabetes - Primary    Her  random glucose is not  elevated but her A1c in the past suggested  she was at risk for developing diabetes.   She has lowered her a1c by following a  low glycemic index diet and is participating occasionally  in an aerobic  exercise activity.    Lab Results  Component Value  Date   HGBA1C 5.6 05/04/2019         Relevant Orders   Lipid panel (Completed)   Direct LDL (Completed)   Comprehensive metabolic panel (Completed)   Hemoglobin A1c (Completed)    Other Visit Diagnoses    Fatigue, unspecified type       Relevant Orders   TSH (Completed)   Encounter for screening mammogram for breast cancer       Relevant Orders   3d mammogram [facility]      I have discontinued Parul C. Neu's sulfamethoxazole-trimethoprim. I am also having her start on Zoster Vaccine Adjuvanted. Additionally, I am having her maintain her Multiple Vitamins-Minerals (PRESERVISION AREDS PO), Prempro, buPROPion, buPROPion, and traZODone.  Meds ordered this encounter  Medications  . Zoster Vaccine Adjuvanted [facility]) injection    Sig: Inject 0.5 mLs into the muscle once for 1 dose.    Dispense:  1 each    Refill:  1    Medications Discontinued During This Encounter  Medication Reason  . buPROPion (WELLBUTRIN XL) 300 MG 24 hr tablet Duplicate  . sulfamethoxazole-trimethoprim (BACTRIM DS) 800-160 MG tablet     Follow-up: Return in about 6 months (around 11/01/2019).   Crecencio Mc, MD

## 2019-05-05 ENCOUNTER — Encounter: Payer: Self-pay | Admitting: Internal Medicine

## 2019-05-05 DIAGNOSIS — R7303 Prediabetes: Secondary | ICD-10-CM | POA: Insufficient documentation

## 2019-05-05 LAB — LIPID PANEL
Cholesterol: 235 mg/dL — ABNORMAL HIGH (ref 0–200)
HDL: 62.3 mg/dL (ref >39.00)
LDL Cholesterol: 142 mg/dL — ABNORMAL HIGH (ref 0–99)
NonHDL: 173.14
Total CHOL/HDL Ratio: 4
Triglycerides: 157 mg/dL — ABNORMAL HIGH (ref 0.0–149.0)
VLDL: 31.4 mg/dL (ref 0.0–40.0)

## 2019-05-05 LAB — COMPREHENSIVE METABOLIC PANEL
ALT: 11 U/L (ref 0–35)
AST: 12 U/L (ref 0–37)
Albumin: 4.4 g/dL (ref 3.5–5.2)
Alkaline Phosphatase: 61 U/L (ref 39–117)
BUN: 21 mg/dL (ref 6–23)
CO2: 29 mEq/L (ref 19–32)
Calcium: 9 mg/dL (ref 8.4–10.5)
Chloride: 105 mEq/L (ref 96–112)
Creatinine, Ser: 0.91 mg/dL (ref 0.40–1.20)
GFR: 61.43 mL/min (ref >60.00)
Glucose, Bld: 95 mg/dL (ref 70–99)
Potassium: 3.8 mEq/L (ref 3.5–5.1)
Sodium: 142 mEq/L (ref 135–145)
Total Bilirubin: 0.3 mg/dL (ref 0.2–1.2)
Total Protein: 6.6 g/dL (ref 6.0–8.3)

## 2019-05-05 LAB — LDL CHOLESTEROL, DIRECT: Direct LDL: 152 mg/dL

## 2019-05-05 LAB — TSH: TSH: 1.79 u[IU]/mL (ref 0.35–4.50)

## 2019-05-05 LAB — HEMOGLOBIN A1C: Hgb A1c MFr Bld: 5.6 % (ref 4.6–6.5)

## 2019-05-05 NOTE — Assessment & Plan Note (Signed)

## 2019-05-05 NOTE — Assessment & Plan Note (Addendum)
Her  random glucose is not  elevated but her A1c in the past suggested  she was at risk for developing diabetes.   She has lowered her a1c by following a  low glycemic index diet and is participating occasionally  in an aerobic  exercise activity.    Lab Results  Component Value Date   HGBA1C 5.6 05/04/2019

## 2019-07-26 ENCOUNTER — Other Ambulatory Visit: Payer: Self-pay | Admitting: Internal Medicine

## 2019-08-22 ENCOUNTER — Other Ambulatory Visit: Payer: Self-pay | Admitting: Internal Medicine

## 2019-09-26 ENCOUNTER — Other Ambulatory Visit: Payer: Self-pay | Admitting: Internal Medicine

## 2019-09-26 ENCOUNTER — Ambulatory Visit
Admission: RE | Admit: 2019-09-26 | Discharge: 2019-09-26 | Disposition: A | Discharge status: Home / Self Care | Payer: BC Managed Care – PPO | Source: Ambulatory Visit | Attending: Internal Medicine | Admitting: Internal Medicine

## 2019-09-26 DIAGNOSIS — Z1231 Encounter for screening mammogram for malignant neoplasm of breast: Secondary | ICD-10-CM | POA: Diagnosis present

## 2019-09-26 DIAGNOSIS — R928 Other abnormal and inconclusive findings on diagnostic imaging of breast: Secondary | ICD-10-CM

## 2019-10-11 ENCOUNTER — Ambulatory Visit
Admission: RE | Admit: 2019-10-11 | Discharge: 2019-10-11 | Disposition: A | Discharge status: Home / Self Care | Payer: BC Managed Care – PPO | Source: Ambulatory Visit | Attending: Internal Medicine | Admitting: Internal Medicine

## 2019-10-11 DIAGNOSIS — R928 Other abnormal and inconclusive findings on diagnostic imaging of breast: Secondary | ICD-10-CM | POA: Diagnosis not present

## 2019-10-20 ENCOUNTER — Telehealth: Payer: Self-pay | Admitting: Internal Medicine

## 2019-10-20 ENCOUNTER — Telehealth: Payer: Self-pay

## 2019-10-20 NOTE — Telephone Encounter (Signed)
PA has been submitted.

## 2019-10-20 NOTE — Telephone Encounter (Signed)
error 

## 2019-10-20 NOTE — Telephone Encounter (Signed)
Pt got new insurance and her PREMPRO 0.3-1.5 MG tablet needs a prior auth now. Please check on other meds as well

## 2019-10-21 ENCOUNTER — Telehealth: Payer: Self-pay

## 2019-10-21 NOTE — Telephone Encounter (Signed)
PA for Prempro has been approved through 10/19/2020. Pt is aware.

## 2019-10-21 NOTE — Telephone Encounter (Signed)
Error

## 2019-10-28 ENCOUNTER — Other Ambulatory Visit: Payer: Self-pay

## 2019-11-01 ENCOUNTER — Ambulatory Visit: Payer: BC Managed Care – PPO | Admitting: Internal Medicine

## 2019-11-08 ENCOUNTER — Ambulatory Visit: Payer: 59 | Admitting: Internal Medicine

## 2019-11-08 ENCOUNTER — Other Ambulatory Visit: Payer: Self-pay

## 2019-11-08 ENCOUNTER — Encounter: Payer: Self-pay | Admitting: Internal Medicine

## 2019-11-08 VITALS — BP 124/78 | HR 106 | Temp 97.0°F | Ht 65.75 in | Wt 187.2 lb

## 2019-11-08 DIAGNOSIS — S52124S Nondisplaced fracture of head of right radius, sequela: Secondary | ICD-10-CM | POA: Diagnosis not present

## 2019-11-08 DIAGNOSIS — F5102 Adjustment insomnia: Secondary | ICD-10-CM

## 2019-11-08 DIAGNOSIS — R69 Illness, unspecified: Secondary | ICD-10-CM | POA: Diagnosis not present

## 2019-11-08 DIAGNOSIS — E559 Vitamin D deficiency, unspecified: Secondary | ICD-10-CM | POA: Diagnosis not present

## 2019-11-08 DIAGNOSIS — F339 Major depressive disorder, recurrent, unspecified: Secondary | ICD-10-CM | POA: Diagnosis not present

## 2019-11-08 DIAGNOSIS — R7303 Prediabetes: Secondary | ICD-10-CM | POA: Diagnosis not present

## 2019-11-08 DIAGNOSIS — R1013 Epigastric pain: Secondary | ICD-10-CM

## 2019-11-08 DIAGNOSIS — Z803 Family history of malignant neoplasm of breast: Secondary | ICD-10-CM

## 2019-11-08 NOTE — Progress Notes (Signed)
Subjective:  Patient ID: Ashley Ward, female    DOB: 10/12/1950  Age: 69 y.o. MRN: [id number]  CC: The primary encounter diagnosis was Vitamin D deficiency. Diagnoses of Epigastric pain, Prediabetes, Major depressive disorder, recurrent episode with melancholic features ([place]), Closed nondisplaced fracture of head of right radius, sequela, Insomnia due to psychological stress, and Family history of breast cancer in first degree relative were also pertinent to this visit.  HPI Ashley Ward presents for follow up on chronic issues including depression.  This visit occurred during the SARS-CoV-2 public health emergency.  Safety protocols were in place, including screening questions prior to the visit, additional usage of staff PPE, and extensive cleaning of exam room while observing appropriate contact time as indicated for disinfecting solutions.   This visit occurred during the SARS-CoV-2 public health emergency.  Safety protocols were in place, including screening questions prior to the visit, additional usage of staff PPE, and extensive cleaning of exam room while observing appropriate contact time as indicated for disinfecting solutions.   Cc:  Epigastric pain  Started 2 months ago, was occurring daily for a few weeks, occurred about  1.5 hours after eating , would take peptic bismol and pain would resolve after 20- 30 minutes . Denies cramping, nausea, and fecal urgency.  No weight loss   2)  recent Nondisplaced distal radial fracture  Right arm.  Occurred on April  29 while playing with grandchildren. Dog tripped her up.  Drove home from beach and  saw [employer],  Placed in hard brace.  Getting better.  Not taking calckum or vitamin D.  Osteopenia   3) depression: symptoms improved on wellbutrin 450 mg daily . Using trazodone for sleep  Outpatient Medications Prior to Visit  Medication Sig Dispense Refill  . buPROPion (WELLBUTRIN XL) 150 MG 24 hr tablet TAKE ONE TABLET EVERY DAY  ALONG WITH 300MG  FOR DAILY DOSE OF 450MG  90 tablet 1  . buPROPion (WELLBUTRIN XL) 300 MG 24 hr tablet Take 1 tablet (300 mg total) by mouth daily. With 150 mg .  Total daily dose 450 mg 90 tablet 1  . Multiple Vitamins-Minerals (PRESERVISION AREDS PO) Take 2 capsules by mouth daily.    Marland Kitchen PREMPRO 0.3-1.5 MG tablet TAKE 1 TABLET BY MOUTH DAILY 84 tablet 0  . traZODone (DESYREL) 50 MG tablet Take 0.5-1 tablets (25-50 mg total) by mouth at bedtime as needed for sleep. Increase as needed max dose 100 mg daily 90 tablet 3   No facility-administered medications prior to visit.    Review of Systems;  Patient denies headache, fevers, malaise, unintentional weight loss, skin rash, eye pain, sinus congestion and sinus pain, sore throat, dysphagia,  hemoptysis , cough, dyspnea, wheezing, chest pain, palpitations, orthopnea, edema, abdominal pain, nausea, melena, diarrhea, constipation, flank pain, dysuria, hematuria, urinary  Frequency, nocturia, numbness, tingling, seizures,  Focal weakness, Loss of consciousness,  Tremor, insomnia, depression, anxiety, and suicidal ideation.      Objective:  BP 124/78   Pulse (!) 106   Temp (!) 97 F (36.1 C) (Temporal)   Ht 5' 5.75" (1.67 m)   Wt 187 lb 3.2 oz (84.9 kg)   SpO2 97%   BMI 30.45 kg/m   BP Readings from Last 3 Encounters:  11/08/19 124/78  05/04/19 116/72  04/19/18 114/68    Wt Readings from Last 3 Encounters:  11/08/19 187 lb 3.2 oz (84.9 kg)  05/04/19 186 lb (84.4 kg)  04/19/18 186 lb 6.4 oz (84.6 kg)  General appearance: alert, cooperative and appears stated age Ears: normal TM's and external ear canals both ears Throat: lips, mucosa, and tongue normal; teeth and gums normal Neck: no adenopathy, no carotid bruit, supple, symmetrical, trachea midline and thyroid not enlarged, symmetric, no tenderness/mass/nodules Back: symmetric, no curvature. ROM normal. No CVA tenderness. Lungs: clear to auscultation bilaterally Heart: regular  rate and rhythm, S1, S2 normal, no murmur, click, rub or gallop Abdomen: soft,  bowel sounds normal; no masses,  no organomegaly. No RUQ or epigastric tenderness  Pulses: 2+ and symmetric Skin: Skin color, texture, turgor normal. No rashes or lesions Lymph nodes: Cervical, supraclavicular, and axillary nodes normal.  Lab Results  Component Value Date   HGBA1C 5.6 05/04/2019   HGBA1C 5.9 03/22/2018   HGBA1C 5.6 04/26/2012    Lab Results  Component Value Date   CREATININE 1.03 11/08/2019   CREATININE 0.91 05/04/2019   CREATININE 0.80 03/22/2018    Lab Results  Component Value Date   WBC 6.7 06/30/2016   HGB 14.1 06/30/2016   HCT 42.1 06/30/2016   PLT 271.0 06/30/2016   GLUCOSE 105 (H) 11/08/2019   CHOL 235 (H) 05/04/2019   TRIG 157.0 (H) 05/04/2019   HDL 62.30 05/04/2019   LDLDIRECT 152.0 05/04/2019   LDLCALC 142 (H) 05/04/2019   ALT 10 11/08/2019   AST 13 11/08/2019   NA 138 11/08/2019   K 4.2 11/08/2019   CL 103 11/08/2019   CREATININE 1.03 11/08/2019   BUN 20 11/08/2019   CO2 28 11/08/2019   TSH 1.79 05/04/2019   HGBA1C 5.6 05/04/2019    US BREAST LTD UNI LEFT INC AXILLA  Result Date: 10/11/2019 CLINICAL DATA:  69 year old patient recalled from recent screening mammogram for evaluation of possible distortion in the left breast. This possible distortion was questioned only in the superior left breast in the MLO projection. EXAM: DIGITAL DIAGNOSTIC LEFT MAMMOGRAM WITH CAD AND TOMO ULTRASOUND LEFT BREAST COMPARISON:  September 26, 2019 and earlier priors ACR Breast Density Category c: The breast tissue is heterogeneously dense, which may obscure small masses. FINDINGS: Spot compression views of the left breast in the MLO projection and a 90 degree lateral view of the left breast performed today with tomography show no architectural distortion. The breast tissue is dense, and ultrasound will also be performed. Mammographic images were processed with CAD. Targeted ultrasound is  performed, showing normal dense tissue in the superior left breast. No distortion, suspicious mass, or abnormal shadowing is identified. IMPRESSION: No evidence of malignancy in the left breast. RECOMMENDATION: Screening mammogram in one year.(Code:SM-B-01Y) I have discussed the findings and recommendations with the patient. If applicable, a reminder letter will be sent to the patient regarding the next appointment. BI-RADS CATEGORY  1: Negative. Electronically Signed   By: Curlene Dolphin M.D.   On: 10/11/2019 11:25   MM DIAG BREAST TOMO UNI LEFT  Result Date: 10/11/2019 CLINICAL DATA:  69 year old patient recalled from recent screening mammogram for evaluation of possible distortion in the left breast. This possible distortion was questioned only in the superior left breast in the MLO projection. EXAM: DIGITAL DIAGNOSTIC LEFT MAMMOGRAM WITH CAD AND TOMO ULTRASOUND LEFT BREAST COMPARISON:  September 26, 2019 and earlier priors ACR Breast Density Category c: The breast tissue is heterogeneously dense, which may obscure small masses. FINDINGS: Spot compression views of the left breast in the MLO projection and a 90 degree lateral view of the left breast performed today with tomography show no architectural distortion. The breast tissue is  dense, and ultrasound will also be performed. Mammographic images were processed with CAD. Targeted ultrasound is performed, showing normal dense tissue in the superior left breast. No distortion, suspicious mass, or abnormal shadowing is identified. IMPRESSION: No evidence of malignancy in the left breast. RECOMMENDATION: Screening mammogram in one year.(Code:SM-[id number]) I have discussed the findings and recommendations with the patient. If applicable, a reminder letter will be sent to the patient regarding the next appointment. BI-RADS CATEGORY  1: Negative. Electronically Signed   By: Curlene Dolphin M.D.   On: 10/11/2019 11:25    Assessment & Plan:   Problem List Items Addressed  This Visit      Unprioritized   Epigastric pain    Exam is benign and she has no warning signs.  Will treat for gastritis and gas   Lab Results  Component Value Date   LIPASE 12.0 11/08/2019    Lab Results  Component Value Date   ALT 10 11/08/2019   AST 13 11/08/2019   ALKPHOS 59 11/08/2019   BILITOT 0.3 11/08/2019          Relevant Orders   Lipase (Completed)   Comprehensive metabolic panel (Completed)   Family history of breast cancer in first degree relative    Diagnostic ultrasound was normal in April 2021       Insomnia due to psychological stress    Continue trazodone       Major depressive disorder, recurrent episode with melancholic features ([place])    Improved on daily dose of wellbutrin  450 mg .  No changes today       Prediabetes   Relevant Orders   Hemoglobin A1C   Radial head fracture, closed    She is not taking vitamin D or calcium. Advised to take 1200 mg calcium and Vitamin D dose TBD        Vitamin D deficiency - Primary    Given her recent radial fracture, will recommend Vitamin D 2000 IUs daily       Relevant Orders   VITAMIN D 25 Hydroxy (Vit-D Deficiency, Fractures) (Completed)      I am having Ashley Ward start on omeprazole. I am also having her maintain her Multiple Vitamins-Minerals (PRESERVISION AREDS PO), buPROPion, traZODone, Prempro, and buPROPion.  Meds ordered this encounter  Medications  . omeprazole (PRILOSEC) 40 MG capsule    Sig: Take 1 capsule (40 mg total) by mouth daily.    Dispense:  30 capsule    Refill:  3    There are no discontinued medications.  Follow-up: No follow-ups on file.   Crecencio Mc, MD

## 2019-11-08 NOTE — Patient Instructions (Addendum)
You need 1200 mg calcium and 1000 IU's of Vitamin D daily (more D if your level today is low)   Your abdominal pain may be gallbladder , or gas.  If your liver enzymes are normal , we will start with treating you for gas:    Try using beano before any meal with any beans , cabbage,  Broccoli, cauliflower and brussel sprouts.    You can use phasyme or Gas X if you still get the pain after eating  If this does not work,  Let me know and we will go ahead and order an ultrasound

## 2019-11-09 DIAGNOSIS — S52123A Displaced fracture of head of unspecified radius, initial encounter for closed fracture: Secondary | ICD-10-CM | POA: Insufficient documentation

## 2019-11-09 DIAGNOSIS — E559 Vitamin D deficiency, unspecified: Secondary | ICD-10-CM | POA: Insufficient documentation

## 2019-11-09 DIAGNOSIS — R1013 Epigastric pain: Secondary | ICD-10-CM | POA: Insufficient documentation

## 2019-11-09 HISTORY — DX: Displaced fracture of head of unspecified radius, initial encounter for closed fracture: S52.123A

## 2019-11-09 LAB — VITAMIN D 25 HYDROXY (VIT D DEFICIENCY, FRACTURES): VITD: 26.73 ng/mL — ABNORMAL LOW (ref 30.00–100.00)

## 2019-11-09 LAB — COMPREHENSIVE METABOLIC PANEL
ALT: 10 U/L (ref 0–35)
AST: 13 U/L (ref 0–37)
Albumin: 4.3 g/dL (ref 3.5–5.2)
Alkaline Phosphatase: 59 U/L (ref 39–117)
BUN: 20 mg/dL (ref 6–23)
CO2: 28 mEq/L (ref 19–32)
Calcium: 9.1 mg/dL (ref 8.4–10.5)
Chloride: 103 mEq/L (ref 96–112)
Creatinine, Ser: 1.03 mg/dL (ref 0.40–1.20)
GFR: 53.16 mL/min — ABNORMAL LOW (ref >60.00)
Glucose, Bld: 105 mg/dL — ABNORMAL HIGH (ref 70–99)
Potassium: 4.2 mEq/L (ref 3.5–5.1)
Sodium: 138 mEq/L (ref 135–145)
Total Bilirubin: 0.3 mg/dL (ref 0.2–1.2)
Total Protein: 6.4 g/dL (ref 6.0–8.3)

## 2019-11-09 LAB — LIPASE: Lipase: 12 U/L (ref 11.0–59.0)

## 2019-11-09 MED ORDER — OMEPRAZOLE 40 MG PO CPDR: 40.0000 mg | DELAYED_RELEASE_CAPSULE | Freq: Every day | ORAL | 3 refills | Status: DC

## 2019-11-09 NOTE — Assessment & Plan Note (Signed)
Continue trazodone 

## 2019-11-09 NOTE — Assessment & Plan Note (Signed)
She is not taking vitamin D or calcium. Advised to take 1200 mg calcium and Vitamin D dose TBD

## 2019-11-09 NOTE — Assessment & Plan Note (Signed)
Given her recent radial fracture, will recommend Vitamin D 2000 IUs daily

## 2019-11-09 NOTE — Assessment & Plan Note (Signed)
Diagnostic ultrasound was normal in April 2021

## 2019-11-09 NOTE — Assessment & Plan Note (Addendum)
Exam is benign and she has no warning signs.  Will treat for gastritis and gas   Lab Results  Component Value Date   LIPASE 12.0 11/08/2019    Lab Results  Component Value Date   ALT 10 11/08/2019   AST 13 11/08/2019   ALKPHOS 59 11/08/2019   BILITOT 0.3 11/08/2019

## 2019-11-09 NOTE — Assessment & Plan Note (Signed)
Improved on daily dose of wellbutrin  450 mg .  No changes today

## 2019-11-22 ENCOUNTER — Other Ambulatory Visit: Payer: Self-pay | Admitting: Internal Medicine

## 2019-12-16 ENCOUNTER — Other Ambulatory Visit: Payer: Self-pay | Admitting: Internal Medicine

## 2020-01-13 DIAGNOSIS — R05 Cough: Secondary | ICD-10-CM | POA: Diagnosis not present

## 2020-01-13 DIAGNOSIS — U071 COVID-19: Secondary | ICD-10-CM | POA: Diagnosis not present

## 2020-01-14 DIAGNOSIS — R05 Cough: Secondary | ICD-10-CM | POA: Diagnosis not present

## 2020-02-15 ENCOUNTER — Other Ambulatory Visit: Payer: Self-pay | Admitting: Internal Medicine

## 2020-02-24 DIAGNOSIS — Z20822 Contact with and (suspected) exposure to covid-19: Secondary | ICD-10-CM | POA: Diagnosis not present

## 2020-03-09 ENCOUNTER — Ambulatory Visit (INDEPENDENT_AMBULATORY_CARE_PROVIDER_SITE_OTHER): Payer: 59

## 2020-03-09 VITALS — Ht 65.75 in | Wt 187.0 lb

## 2020-03-09 DIAGNOSIS — Z0000 Encounter for general adult medical examination without abnormal findings: Secondary | ICD-10-CM | POA: Diagnosis not present

## 2020-03-09 NOTE — Patient Instructions (Addendum)
Ashley Ward , Thank you for taking time to come for your Medicare Wellness Visit. I appreciate your ongoing commitment to your health goals. Please review the following plan we discussed and let me know if I can assist you in the future.   These are the goals we discussed: Goals      Patient Stated   .  Increase physical activity (pt-stated)      I want to walk with a friend.  I want to play pickle ball.     .  Weight (lb) < 186 lb (84.4 kg) (pt-stated)      I want to start weight watchers program Lose 15lb        This is a list of the screening recommended for you and due dates:  Health Maintenance  Topic Date Due  . Flu Shot  09/13/2020*  . Mammogram  09/25/2020  . Tetanus Vaccine  03/20/2024  . Colon Cancer Screening  07/01/2026  . DEXA scan (bone density measurement)  Completed  . COVID-19 Vaccine  Completed  .  Hepatitis C: One time screening is recommended by Center for Disease Control  (CDC) for  adults born from [birth year] through 1965.   Completed  . Pneumonia vaccines  Completed  *Topic was postponed. The date shown is not the original due date.    Immunizations Immunization History  Administered Date(s) Administered  . Influenza Split 03/20/2014, 03/28/2015, 03/26/2016  . Influenza, High Dose Seasonal PF 03/29/2019  . Influenza-Unspecified 03/16/2013, 03/18/2018  . PFIZER SARS-COV-2 Vaccination 07/15/2019, 08/05/2019  . Pneumococcal Conjugate-13 03/26/2016  . Pneumococcal Polysaccharide-23 04/12/2019  . Tdap 04/26/2012, 03/20/2014   Keep all routine maintenance appointments.   Annual 05/02/20 @ 8:00; fasting  Advanced directives: mailed per request  Conditions/risks identified: none new  Follow up in one year for your annual wellness visit.   Preventive Care 67 Years and Older, Female Preventive care refers to lifestyle choices and visits with your health care provider that can promote health and wellness. What does preventive care include?  A yearly  physical exam. This is also called an annual well check.  Dental exams once or twice a year.  Routine eye exams. Ask your health care provider how often you should have your eyes checked.  Personal lifestyle choices, including:  Daily care of your teeth and gums.  Regular physical activity.  Eating a healthy diet.  Avoiding tobacco and drug use.  Limiting alcohol use.  Practicing safe sex.  Taking low-dose aspirin every day.  Taking vitamin and mineral supplements as recommended by your health care provider. What happens during an annual well check? The services and screenings done by your health care provider during your annual well check will depend on your age, overall health, lifestyle risk factors, and family history of disease. Counseling  Your health care provider may ask you questions about your:  Alcohol use.  Tobacco use.  Drug use.  Emotional well-being.  Home and relationship well-being.  Sexual activity.  Eating habits.  History of falls.  Memory and ability to understand (cognition).  Work and work Statistician.  Reproductive health. Screening  You may have the following tests or measurements:  Height, weight, and BMI.  Blood pressure.  Lipid and cholesterol levels. These may be checked every 5 years, or more frequently if you are over 71 years old.  Skin check.  Lung cancer screening. You may have this screening every year starting at age 58 if you have a 30-pack-year history of smoking and  currently smoke or have quit within the past 15 years.  Fecal occult blood test (FOBT) of the stool. You may have this test every year starting at age 18.  Flexible sigmoidoscopy or colonoscopy. You may have a sigmoidoscopy every 5 years or a colonoscopy every 10 years starting at age 4.  Hepatitis C blood test.  Hepatitis B blood test.  Sexually transmitted disease (STD) testing.  Diabetes screening. This is done by checking your blood sugar  (glucose) after you have not eaten for a while (fasting). You may have this done every 1-3 years.  Bone density scan. This is done to screen for osteoporosis. You may have this done starting at age 76.  Mammogram. This may be done every 1-2 years. Talk to your health care provider about how often you should have regular mammograms. Talk with your health care provider about your test results, treatment options, and if necessary, the need for more tests. Vaccines  Your health care provider may recommend certain vaccines, such as:  Influenza vaccine. This is recommended every year.  Tetanus, diphtheria, and acellular pertussis (Tdap, Td) vaccine. You may need a Td booster every 10 years.  Zoster vaccine. You may need this after age 57.  Pneumococcal 13-valent conjugate (PCV13) vaccine. One dose is recommended after age 79.  Pneumococcal polysaccharide (PPSV23) vaccine. One dose is recommended after age 67. Talk to your health care provider about which screenings and vaccines you need and how often you need them. This information is not intended to replace advice given to you by your health care provider. Make sure you discuss any questions you have with your health care provider. Document Released: 06/29/2015 Document Revised: 02/20/2016 Document Reviewed: 04/03/2015 Elsevier Interactive Patient Education  2017 [employer] Prevention in the Home Falls can cause injuries. They can happen to people of all ages. There are many things you can do to make your home safe and to help prevent falls. What can I do on the outside of my home?  Regularly fix the edges of walkways and driveways and fix any cracks.  Remove anything that might make you trip as you walk through a door, such as a raised step or threshold.  Trim any bushes or trees on the path to your home.  Use bright outdoor lighting.  Clear any walking paths of anything that might make someone trip, such as rocks or  tools.  Regularly check to see if handrails are loose or broken. Make sure that both sides of any steps have handrails.  Any raised decks and porches should have guardrails on the edges.  Have any leaves, snow, or ice cleared regularly.  Use sand or salt on walking paths during winter.  Clean up any spills in your garage right away. This includes oil or grease spills. What can I do in the bathroom?  Use night lights.  Install grab bars by the toilet and in the tub and shower. Do not use towel bars as grab bars.  Use non-skid mats or decals in the tub or shower.  If you need to sit down in the shower, use a plastic, non-slip stool.  Keep the floor dry. Clean up any water that spills on the floor as soon as it happens.  Remove soap buildup in the tub or shower regularly.  Attach bath mats securely with double-sided non-slip rug tape.  Do not have throw rugs and other things on the floor that can make you trip. What can I do  in the bedroom?  Use night lights.  Make sure that you have a light by your bed that is easy to reach.  Do not use any sheets or blankets that are too big for your bed. They should not hang down onto the floor.  Have a firm chair that has side arms. You can use this for support while you get dressed.  Do not have throw rugs and other things on the floor that can make you trip. What can I do in the kitchen?  Clean up any spills right away.  Avoid walking on wet floors.  Keep items that you use a lot in easy-to-reach places.  If you need to reach something above you, use a strong step stool that has a grab bar.  Keep electrical cords out of the way.  Do not use floor polish or wax that makes floors slippery. If you must use wax, use non-skid floor wax.  Do not have throw rugs and other things on the floor that can make you trip. What can I do with my stairs?  Do not leave any items on the stairs.  Make sure that there are handrails on both  sides of the stairs and use them. Fix handrails that are broken or loose. Make sure that handrails are as long as the stairways.  Check any carpeting to make sure that it is firmly attached to the stairs. Fix any carpet that is loose or worn.  Avoid having throw rugs at the top or bottom of the stairs. If you do have throw rugs, attach them to the floor with carpet tape.  Make sure that you have a light switch at the top of the stairs and the bottom of the stairs. If you do not have them, ask someone to add them for you. What else can I do to help prevent falls?  Wear shoes that:  Do not have high heels.  Have rubber bottoms.  Are comfortable and fit you well.  Are closed at the toe. Do not wear sandals.  If you use a stepladder:  Make sure that it is fully opened. Do not climb a closed stepladder.  Make sure that both sides of the stepladder are locked into place.  Ask someone to hold it for you, if possible.  Clearly mark and make sure that you can see:  Any grab bars or handrails.  First and last steps.  Where the edge of each step is.  Use tools that help you move around (mobility aids) if they are needed. These include:  Canes.  Walkers.  Scooters.  Crutches.  Turn on the lights when you go into a dark area. Replace any light bulbs as soon as they burn out.  Set up your furniture so you have a clear path. Avoid moving your furniture around.  If any of your floors are uneven, fix them.  If there are any pets around you, be aware of where they are.  Review your medicines with your doctor. Some medicines can make you feel dizzy. This can increase your chance of falling. Ask your doctor what other things that you can do to help prevent falls. This information is not intended to replace advice given to you by your health care provider. Make sure you discuss any questions you have with your health care provider. Document Released: 03/29/2009 Document Revised:  11/08/2015 Document Reviewed: 07/07/2014 Elsevier Interactive Patient Education  2017 [employer].

## 2020-03-09 NOTE — Progress Notes (Addendum)
Subjective:   Ashley Ward is a 69 y.o. female who presents for Medicare Annual (Subsequent) preventive examination.  Review of Systems    No ROS.  Medicare Wellness Virtual Visit.   Cardiac Risk Factors include: advanced age (>36mn, >>87women)     Objective:    Today's Vitals   03/09/20 0904  Weight: 187 lb (84.8 kg)  Height: 5' 5.75" (1.67 m)   Body mass index is 30.41 kg/m.  Advanced Directives 03/09/2020 03/09/2019  Does Patient Have a Medical Advance Directive? No No  Would patient like information on creating a medical advance directive? No - Patient declined Yes (MAU/Ambulatory/Procedural Areas - Information given)    Current Medications (verified) Outpatient Encounter Medications as of 03/09/2020  Medication Sig   buPROPion (WELLBUTRIN XL) 150 MG 24 hr tablet TAKE ONE TABLET EVERY DAY ALONG WITH 300MG FOR DAILY DOSE OF 450MG   buPROPion (WELLBUTRIN XL) 300 MG 24 hr tablet TAKE ONE TABLET EVERY DAY ALONG WITH 150MG FOR DAILY DOSE OF 450MG   Multiple Vitamins-Minerals (PRESERVISION AREDS PO) Take 2 capsules by mouth daily.   omeprazole (PRILOSEC) 40 MG capsule Take 1 capsule (40 mg total) by mouth daily.   PREMPRO 0.3-1.5 MG tablet TAKE 1 TABLET BY MOUTH DAILY   traZODone (DESYREL) 50 MG tablet Take 0.5-1 tablets (25-50 mg total) by mouth at bedtime as needed for sleep. Increase as needed max dose 100 mg daily (Patient not taking: Reported on 03/09/2020)   No facility-administered encounter medications on file as of 03/09/2020.    Allergies (verified) Penicillins and Asa [aspirin]   History: Past Medical History:  Diagnosis Date   Hyperlipidemia    Menopause    Menopause    Past Surgical History:  Procedure Laterality Date   DILATION AND CURETTAGE, DIAGNOSTIC / THERAPEUTIC  2007   [place],  for spotting, fresolved   Family History  Problem Relation Age of Onset   Diabetes Mother    Cancer Mother        lymphoma., in remission   COPD Father    Cancer  Sister        half sister , BRCA, negative genetic testing   Alcohol abuse Sister    Breast cancer Sister 463  Breast cancer Cousin    Social History   Socioeconomic History   Marital status: Married    Spouse name: Not on file   Number of children: Not on file   Years of education: Not on file   Highest education level: Not on file  Occupational History   Not on file  Tobacco Use   Smoking status: Never Smoker   Smokeless tobacco: Never Used  Vaping Use   Vaping Use: Never used  Substance and Sexual Activity   Alcohol use: Yes    Alcohol/week: 4.0 standard drinks    Types: 4 Glasses of wine per week   Drug use: No   Sexual activity: Yes  Other Topics Concern   Not on file  Social History Narrative   Not on file   Social Determinants of Health   Financial Resource Strain: Low Risk    Difficulty of Paying Living Expenses: Not hard at all  Food Insecurity: No Food Insecurity   Worried About RCharity fundraiserin the Last Year: Never true   REl [place] the Last Year: Never true  Transportation Needs: No Transportation Needs   Lack of Transportation (Medical): No   Lack of Transportation (Non-Medical): No  Physical Activity:    Days of Exercise per Week: Not on file   Minutes of Exercise per Session: Not on file  Stress: No Stress Concern Present   Feeling of Stress : Not at all  Social Connections:    Frequency of Communication with Friends and Family: Not on file   Frequency of Social Gatherings with Friends and Family: Not on file   Attends Religious Services: Not on file   Active Member of Clubs or Organizations: Not on file   Attends Archivist Meetings: Not on file   Marital Status: Not on file    Tobacco Counseling Counseling given: Not Answered   Clinical Intake:  Pre-visit preparation completed: Yes        Diabetes: No  How often do you need to have someone help you when you read instructions, pamphlets, or other written  materials from your doctor or pharmacy?: 1 - Never    Interpreter Needed?: No      Activities of Daily Living In your present state of health, do you have any difficulty performing the following activities: 03/09/2020  Hearing? N  Vision? N  Difficulty concentrating or making decisions? N  Walking or climbing stairs? N  Dressing or bathing? N  Doing errands, shopping? N  Preparing Food and eating ? N  Using the Toilet? N  In the past six months, have you accidently leaked urine? N  Do you have problems with loss of bowel control? N  Managing your Medications? N  Managing your Finances? N  Housekeeping or managing your Housekeeping? N  Some recent data might be hidden    Patient Care Team: Crecencio Mc, MD as PCP - General (Internal Medicine)  Indicate any recent Medical Services you may have received from other than [facility] providers in the past year (date may be approximate).     Assessment:   This is a routine wellness examination for Ashley Ward.  I connected with [name] today by telephone and verified that I am speaking with the correct person using two identifiers. Location patient: home Location provider: work Persons participating in the virtual visit: patient, Marine scientist.    I discussed the limitations, risks, security and privacy concerns of performing an evaluation and management service by telephone and the availability of in person appointments. The patient expressed understanding and verbally consented to this telephonic visit.    Interactive audio and video telecommunications were attempted between this provider and patient, however failed, due to patient having technical difficulties OR patient did not have access to video capability.  We continued and completed visit with audio only.  Some vital signs may be absent or patient reported.   Hearing/Vision screen  Hearing Screening   125Hz  250Hz  500Hz  1000Hz  2000Hz  3000Hz  4000Hz  6000Hz  8000Hz   Right ear:             Left ear:           Comments: Patient is able to hear conversational tones without difficulty.  No issues reported.  Vision Screening Comments: Wears corrective lenses Visual acuity not assessed, virtual visit.  They have seen their ophthalmologist in the last 12 months.     Dietary issues and exercise activities discussed: Current Exercise Habits: The patient does not participate in regular exercise at present Healthy diet Good water intake Goals       Patient Stated     Increase physical activity (pt-stated)      I want to walk with a friend.  I want  to play pickle ball.       Weight (lb) < 186 lb (84.4 kg) (pt-stated)      I want to start weight watchers program Lose 15lb        Depression Screen PHQ 2/9 Scores 03/09/2020 11/08/2019 03/09/2019 03/22/2018 06/30/2016 04/27/2012  PHQ - 2 Score 0 1 1 4 6  0  PHQ- 9 Score - 4 4 18 14  -    Fall Risk Fall Risk  03/09/2020 05/04/2019 03/09/2019 03/22/2018  Falls in the past year? 0 0 0 No  Number falls in past yr: 0 - 0 -  Injury with Fall? - - 0 -  Follow up Falls evaluation completed Falls evaluation completed - -   Handrails in use when climbing stairs? Yes Home free of loose throw rugs in walkways, pet beds, electrical cords, etc? Yes  Adequate lighting in your home to reduce risk of falls? Yes   ASSISTIVE DEVICES UTILIZED TO PREVENT FALLS:  Life alert? No  Use of a cane, walker or w/c? No   TIMED UP AND GO: Was the test performed? No . Virtual visit.   Cognitive Function: Patient is alert and oriented x3.  Denies difficulty focusing, memory loss, making decisions.  Enjoys reading and painting for brain health.      6CIT Screen 03/09/2019  What Year? 0 points  What month? 0 points  What time? 0 points  Count back from 20 0 points  Months in reverse 0 points  Repeat phrase 0 points  Total Score 0    Immunizations Immunization History  Administered Date(s) Administered   Influenza Split 03/20/2014,  03/28/2015, 03/26/2016   Influenza, High Dose Seasonal PF 03/29/2019   Influenza-Unspecified 03/16/2013, 03/18/2018   PFIZER SARS-COV-2 Vaccination 07/15/2019, 08/05/2019   Pneumococcal Conjugate-13 03/26/2016   Pneumococcal Polysaccharide-23 04/12/2019   Tdap 04/26/2012, 03/20/2014    Health Maintenance Health Maintenance  Topic Date Due   INFLUENZA VACCINE  09/13/2020 (Originally 01/15/2020)   MAMMOGRAM  09/25/2020   TETANUS/TDAP  03/20/2024   COLONOSCOPY  07/01/2026   DEXA SCAN  Completed   COVID-19 Vaccine  Completed   Hepatitis C Screening  Completed   PNA vac Low Risk Adult  Completed   Dental Screening: Recommended annual dental exams for proper oral hygiene  Community Resource Referral / Chronic Care Management: CRR required this visit?  No   CCM required this visit?  No      Plan:   Keep all routine maintenance appointments.   Annual 05/02/20 @ 8:00; fasting  I have personally reviewed and noted the following in the patient's chart:   Medical and social history Use of alcohol, tobacco or illicit drugs  Current medications and supplements Functional ability and status Nutritional status Physical activity Advanced directives List of other physicians Hospitalizations, surgeries, and ER visits in previous 12 months Vitals Screenings to include cognitive, depression, and falls Referrals and appointments  In addition, I have reviewed and discussed with patient certain preventive protocols, quality metrics, and best practice recommendations. A written personalized care plan for preventive services as well as general preventive health recommendations were provided to patient via mychart.     OBrien-Blaney, Kamarian Sahakian L, LPN   6/44/0347    I have reviewed the above information and agree with above.   Deborra Medina, MD

## 2020-03-16 ENCOUNTER — Other Ambulatory Visit: Payer: Self-pay | Admitting: Internal Medicine

## 2020-03-19 DIAGNOSIS — H2513 Age-related nuclear cataract, bilateral: Secondary | ICD-10-CM | POA: Diagnosis not present

## 2020-05-02 ENCOUNTER — Encounter: Payer: Self-pay | Admitting: Internal Medicine

## 2020-05-02 ENCOUNTER — Ambulatory Visit (INDEPENDENT_AMBULATORY_CARE_PROVIDER_SITE_OTHER): Payer: 59 | Admitting: Internal Medicine

## 2020-05-02 ENCOUNTER — Other Ambulatory Visit: Payer: Self-pay

## 2020-05-02 VITALS — BP 116/78 | HR 79 | Temp 98.2°F | Resp 14 | Ht 65.75 in | Wt 182.6 lb

## 2020-05-02 DIAGNOSIS — Z0001 Encounter for general adult medical examination with abnormal findings: Secondary | ICD-10-CM | POA: Diagnosis not present

## 2020-05-02 DIAGNOSIS — R69 Illness, unspecified: Secondary | ICD-10-CM | POA: Diagnosis not present

## 2020-05-02 DIAGNOSIS — F339 Major depressive disorder, recurrent, unspecified: Secondary | ICD-10-CM | POA: Diagnosis not present

## 2020-05-02 DIAGNOSIS — E559 Vitamin D deficiency, unspecified: Secondary | ICD-10-CM

## 2020-05-02 DIAGNOSIS — E785 Hyperlipidemia, unspecified: Secondary | ICD-10-CM

## 2020-05-02 DIAGNOSIS — R7303 Prediabetes: Secondary | ICD-10-CM | POA: Diagnosis not present

## 2020-05-02 LAB — COMPREHENSIVE METABOLIC PANEL
ALT: 11 U/L (ref 0–35)
AST: 12 U/L (ref 0–37)
Albumin: 4.1 g/dL (ref 3.5–5.2)
Alkaline Phosphatase: 60 U/L (ref 39–117)
BUN: 18 mg/dL (ref 6–23)
CO2: 29 mEq/L (ref 19–32)
Calcium: 8.6 mg/dL (ref 8.4–10.5)
Chloride: 106 mEq/L (ref 96–112)
Creatinine, Ser: 0.82 mg/dL (ref 0.40–1.20)
GFR: 73.06 mL/min (ref >60.00)
Glucose, Bld: 107 mg/dL — ABNORMAL HIGH (ref 70–99)
Potassium: 4 mEq/L (ref 3.5–5.1)
Sodium: 141 mEq/L (ref 135–145)
Total Bilirubin: 0.5 mg/dL (ref 0.2–1.2)
Total Protein: 6.3 g/dL (ref 6.0–8.3)

## 2020-05-02 LAB — LIPID PANEL
Cholesterol: 208 mg/dL — ABNORMAL HIGH (ref 0–200)
HDL: 59.6 mg/dL (ref >39.00)
LDL Cholesterol: 120 mg/dL — ABNORMAL HIGH (ref 0–99)
NonHDL: 148.44
Total CHOL/HDL Ratio: 3
Triglycerides: 141 mg/dL (ref 0.0–149.0)
VLDL: 28.2 mg/dL (ref 0.0–40.0)

## 2020-05-02 LAB — HEMOGLOBIN A1C: Hgb A1c MFr Bld: 5.6 % (ref 4.6–6.5)

## 2020-05-02 LAB — VITAMIN D 25 HYDROXY (VIT D DEFICIENCY, FRACTURES): VITD: 20.72 ng/mL — ABNORMAL LOW (ref 30.00–100.00)

## 2020-05-02 MED ORDER — ZOSTER VAC RECOMB ADJUVANTED 50 MCG/0.5ML IM SUSR: 0.5000 mL | Freq: Once | INTRAMUSCULAR | 1 refills | Status: AC

## 2020-05-02 MED ORDER — ESCITALOPRAM OXALATE 10 MG PO TABS: 10.0000 mg | ORAL_TABLET | Freq: Every day | ORAL | 0 refills | Status: DC

## 2020-05-02 NOTE — Patient Instructions (Addendum)
I recommend adding lexapro back for the next 3 months      The ShingRx vaccine is now available in local pharmacies and is much more protective than the old one  Zostavax  (it is about 97%  Effective in preventing shingles). .   It is therefore ADVISED for all interested adults over 50 to prevent shingles so I have printed you a prescription for it.  (it requires a 2nd dose 2 too 6 months after the first one) .  It will cause you to have flu  like symptoms for 2 days.  IT IS NOT A LIVE VIRUS   Health Maintenance After Age 57 After age 79, you are at a higher risk for certain long-term diseases and infections as well as injuries from falls. Falls are a major cause of broken bones and head injuries in people who are older than age 57. Getting regular preventive care can help to keep you healthy and well. Preventive care includes getting regular testing and making lifestyle changes as recommended by your health care provider. Talk with your health care provider about:  Which screenings and tests you should have. A screening is a test that checks for a disease when you have no symptoms.  A diet and exercise plan that is right for you. What should I know about screenings and tests to prevent falls? Screening and testing are the best ways to find a health problem early. Early diagnosis and treatment give you the best chance of managing medical conditions that are common after age 34. Certain conditions and lifestyle choices may make you more likely to have a fall. Your health care provider may recommend:  Regular vision checks. Poor vision and conditions such as cataracts can make you more likely to have a fall. If you wear glasses, make sure to get your prescription updated if your vision changes.  Medicine review. Work with your health care provider to regularly review all of the medicines you are taking, including over-the-counter medicines. Ask your health care provider about any side effects that  may make you more likely to have a fall. Tell your health care provider if any medicines that you take make you feel dizzy or sleepy.  Osteoporosis screening. Osteoporosis is a condition that causes the bones to get weaker. This can make the bones weak and cause them to break more easily.  Blood pressure screening. Blood pressure changes and medicines to control blood pressure can make you feel dizzy.  Strength and balance checks. Your health care provider may recommend certain tests to check your strength and balance while standing, walking, or changing positions.  Foot health exam. Foot pain and numbness, as well as not wearing proper footwear, can make you more likely to have a fall.  Depression screening. You may be more likely to have a fall if you have a fear of falling, feel emotionally low, or feel unable to do activities that you used to do.  Alcohol use screening. Using too much alcohol can affect your balance and may make you more likely to have a fall. What actions can I take to lower my risk of falls? General instructions  Talk with your health care provider about your risks for falling. Tell your health care provider if: ? You fall. Be sure to tell your health care provider about all falls, even ones that seem minor. ? You feel dizzy, sleepy, or off-balance.  Take over-the-counter and prescription medicines only as told by your health care provider.  These include any supplements.  Eat a healthy diet and maintain a healthy weight. A healthy diet includes low-fat dairy products, low-fat (lean) meats, and fiber from whole grains, beans, and lots of fruits and vegetables. Home safety  Remove any tripping hazards, such as rugs, cords, and clutter.  Install safety equipment such as grab bars in bathrooms and safety rails on stairs.  Keep rooms and walkways well-lit. Activity   Follow a regular exercise program to stay fit. This will help you maintain your balance. Ask your  health care provider what types of exercise are appropriate for you.  If you need a cane or walker, use it as recommended by your health care provider.  Wear supportive shoes that have nonskid soles. Lifestyle  Do not drink alcohol if your health care provider tells you not to drink.  If you drink alcohol, limit how much you have: ? 0-1 drink a day for women. ? 0-2 drinks a day for men.  Be aware of how much alcohol is in your drink. In the [place] one drink equals one typical bottle of beer (12 oz), one-half glass of wine (5 oz), or one shot of hard liquor (1 oz).  Do not use any products that contain nicotine or tobacco, such as cigarettes and e-cigarettes. If you need help quitting, ask your health care provider. Summary  Having a healthy lifestyle and getting preventive care can help to protect your health and wellness after age 49.  Screening and testing are the best way to find a health problem early and help you avoid having a fall. Early diagnosis and treatment give you the best chance for managing medical conditions that are more common for people who are older than age 39.  Falls are a major cause of broken bones and head injuries in people who are older than age 53. Take precautions to prevent a fall at home.  Work with your health care provider to learn what changes you can make to improve your health and wellness and to prevent falls. This information is not intended to replace advice given to you by your health care provider. Make sure you discuss any questions you have with your health care provider. Document Revised: 09/23/2018 Document Reviewed: 04/15/2017 Elsevier Patient Education  2020 [employer].

## 2020-05-02 NOTE — Progress Notes (Signed)
Patient ID: Ashley Ward, female    DOB: 04-13-1951  Age: 69 y.o. MRN: [id number]  The patient is here for annual preventive  examination and management of other chronic and acute problems.   The risk factors are reflected in the social history.  The roster of all physicians providing medical care to patient - is listed in the Snapshot section of the chart.  Activities of daily living:  The patient is 100% independent in all ADLs: dressing, toileting, feeding as well as independent mobility  Home safety : The patient has smoke detectors in the home. They wear seatbelts.  There are no firearms at home. There is no violence in the home.   There is no risks for hepatitis, STDs or HIV. There is no   history of blood transfusion. They have no travel history to infectious disease endemic areas of the world.  The patient has seen their dentist in the last six month. They have seen their eye doctor in the last year. They admit to slight hearing difficulty with regard to whispered voices and some television programs.  They have deferred audiologic testing in the last year.  They do not  have excessive sun exposure. Discussed the need for sun protection: hats, long sleeves and use of sunscreen if there is significant sun exposure.   Diet: the importance of a healthy diet is discussed. They do have a healthy diet. She has lost  5 lbs since her last visit   The benefits of regular aerobic exercise were discussed. She  Does not walk or exercise regularly  But has started to play pickle ball 2 times    Depression screen: there are no signs or vegative symptoms of depression- irritability, change in appetite, anhedonia, sadness/tearfullness.  Cognitive assessment: the patient manages all their financial and personal affairs and is actively engaged. They could relate day,date,year and events; recalled 2/3 objects at 3 minutes; performed clock-face test normally.  The following portions of the patient's  history were reviewed and updated as appropriate: allergies, current medications, past family history, past medical history,  past surgical history, past social history  and problem list.  Visual acuity was not assessed per patient preference since she has regular follow up with her ophthalmologist. Hearing and body mass index were assessed and reviewed.   During the course of the visit the patient was educated and counseled about appropriate screening and preventive services including : fall prevention , diabetes screening, nutrition counseling, colorectal cancer screening, and recommended immunizations.    CC: The primary encounter diagnosis was Hyperlipidemia LDL goal <160. Diagnoses of Prediabetes, Vitamin D deficiency, Encounter for general adult medical examination with abnormal findings, and Major depressive disorder, recurrent episode with melancholic features ([place]) were also pertinent to this visit.  She has been "doing fine,"  But feels the approaching anniversary of her grandson's death starting to affect her mood  .  She is tearful just mentioning his name. She increased her wellbutrin dose on her own yesterday.    Nocturia 2-3 times per night for the last 2 months . No urge incontinence.  Drinks sweet tea most of the evening after dinner .  No dysuria,  No daytime  frequency   History Ashley Ward has a past medical history of Hyperlipidemia, Menopause, and Menopause.   She has a past surgical history that includes Dilation and curettage, diagnostic / therapeutic (2007).   Her family history includes Alcohol abuse in her sister; Breast cancer in her cousin; Breast cancer (age  of onset: 40) in her sister; COPD in her father; Cancer in her mother and sister; Diabetes in her mother.She reports that she has never smoked. She has never used smokeless tobacco. She reports current alcohol use of about 4.0 standard drinks of alcohol per week. She reports that she does not use drugs.  Outpatient  Medications Prior to Visit  Medication Sig Dispense Refill  . buPROPion (WELLBUTRIN XL) 150 MG 24 hr tablet TAKE ONE TABLET EVERY DAY ALONG WITH 300MG  FOR DAILY DOSE OF 450MG  90 tablet 1  . buPROPion (WELLBUTRIN XL) 300 MG 24 hr tablet TAKE ONE TABLET EVERY DAY ALONG WITH 150MG  FOR DAILY DOSE OF 450MG  90 tablet 1  . Multiple Vitamins-Minerals (PRESERVISION AREDS PO) Take 2 capsules by mouth daily.    Marland Kitchen omeprazole (PRILOSEC) 40 MG capsule Take 1 capsule (40 mg total) by mouth daily. 30 capsule 3  . PREMPRO 0.3-1.5 MG tablet TAKE 1 TABLET BY MOUTH DAILY 84 tablet 0  . traZODone (DESYREL) 50 MG tablet Take 0.5-1 tablets (25-50 mg total) by mouth at bedtime as needed for sleep. Increase as needed max dose 100 mg daily 90 tablet 3   No facility-administered medications prior to visit.    Review of Systems   Patient denies headache, fevers, malaise, unintentional weight loss, skin rash, eye pain, sinus congestion and sinus pain, sore throat, dysphagia,  hemoptysis , cough, dyspnea, wheezing, chest pain, palpitations, orthopnea, edema, abdominal pain, nausea, melena, diarrhea, constipation, flank pain, dysuria, hematuria, urinary  Frequency, nocturia, numbness, tingling, seizures,  Focal weakness, Loss of consciousness,  Tremor, insomnia, depression, anxiety, and suicidal ideation.      Objective:  BP 116/78 (BP Location: Left Arm, Patient Position: Sitting, Cuff Size: Normal)   Pulse 79   Temp 98.2 F (36.8 C) (Oral)   Resp 14   Ht 5' 5.75" (1.67 m)   Wt 182 lb 9.6 oz (82.8 kg)   SpO2 97%   BMI 29.70 kg/m   Physical Exam  General appearance: alert, cooperative and appears stated age Head: Normocephalic, without obvious abnormality, atraumatic Eyes: conjunctivae/corneas clear. PERRL, EOM's intact. Fundi benign. Ears: normal TM's and external ear canals both ears Nose: Nares normal. Septum midline. Mucosa normal. No drainage or sinus tenderness. Throat: lips, mucosa, and tongue normal;  teeth and gums normal Neck: no adenopathy, no carotid bruit, no JVD, supple, symmetrical, trachea midline and thyroid not enlarged, symmetric, no tenderness/mass/nodules Lungs: clear to auscultation bilaterally Breasts: normal appearance, no masses or tenderness Heart: regular rate and rhythm, S1, S2 normal, no murmur, click, rub or gallop Abdomen: soft, non-tender; bowel sounds normal; no masses,  no organomegaly Extremities: extremities normal, atraumatic, no cyanosis or edema Pulses: 2+ and symmetric Skin: Skin color, texture, turgor normal. No rashes or lesions Neurologic: Alert and oriented X 3, normal strength and tone. Normal symmetric reflexes. Normal coordination and gait.     Assessment & Plan:   Problem List Items Addressed This Visit      Unprioritized   Vitamin D deficiency    Recurrent.  Will resume mega dose for 12 weekly doses.       Relevant Orders   VITAMIN D 25 Hydroxy (Vit-D Deficiency, Fractures) (Completed)   Prediabetes    Her  random glucose is not  elevated but her A1c in the past suggested  she was at risk for developing diabetes.   She has lowered her a1c by following a  low glycemic index diet and is participating occasionally  in an aerobic  exercise activity.    Lab Results  Component Value Date   HGBA1C 5.6 05/02/2020         Relevant Orders   Comprehensive metabolic panel (Completed)   Hemoglobin A1c (Completed)   Major depressive disorder, recurrent episode with melancholic features ([place])    SYMPTOMS have been worse for the last several weeks,  Due to the anniversary of her grandson's death coming up; despite max dose of wellbutrin (450 ,g daily adding back lexapro 10 mg daily for the next 3 months        Relevant Medications   escitalopram (LEXAPRO) 10 MG tablet   Hyperlipidemia LDL goal <160 - Primary    Current risk using the AHA calculator is 7%.  No evidence of atherosclerosis by imaging studies.  No statin therapy advised  Lab  Results  Component Value Date   CHOL 208 (H) 05/02/2020   HDL 59.60 05/02/2020   LDLCALC 120 (H) 05/02/2020   LDLDIRECT 152.0 05/04/2019   TRIG 141.0 05/02/2020   CHOLHDL 3 05/02/2020         Relevant Orders   Lipid panel (Completed)   Encounter for general adult medical examination with abnormal findings    age appropriate education and counseling updated, referrals for preventative services and immunizations addressed, dietary and smoking counseling addressed, most recent labs reviewed.  I have personally reviewed and have noted:  1) the patient's medical and social history 2) The pt's use of alcohol, tobacco, and illicit drugs 3) The patient's current medications and supplements 4) Functional ability including ADL's, fall risk, home safety risk, hearing and visual impairment 5) Diet and physical activities 6) Evidence for depression or mood disorder 7) The patient's height, weight, and BMI have been recorded in the chart  I have made referrals, and provided counseling and education based on review of the above         I am having Ashley Ward start on Zoster Vaccine Adjuvanted and ergocalciferol. I am also having her maintain her Multiple Vitamins-Minerals (PRESERVISION AREDS PO), traZODone, omeprazole, buPROPion, buPROPion, Prempro, and escitalopram.  Meds ordered this encounter  Medications  . escitalopram (LEXAPRO) 10 MG tablet    Sig: Take 1 tablet (10 mg total) by mouth daily.    Dispense:  90 tablet    Refill:  0  . Zoster Vaccine Adjuvanted [facility]) injection    Sig: Inject 0.5 mLs into the muscle once for 1 dose.    Dispense:  1 each    Refill:  1  . ergocalciferol (VITAMIN D2) 1.25 MG (50000 UT) capsule    Sig: Take 1 capsule (50,000 Units total) by mouth once a week.    Dispense:  12 capsule    Refill:  0    There are no discontinued medications.  Follow-up: No follow-ups on file.   Crecencio Mc, MD

## 2020-05-02 NOTE — Assessment & Plan Note (Signed)
SYMPTOMS have been worse for the last several weeks,  Due to the anniversary of her grandson's death coming up; despite max dose of wellbutrin (450 ,g daily adding back lexapro 10 mg daily for the next 3 months

## 2020-05-02 NOTE — Assessment & Plan Note (Signed)

## 2020-05-03 MED ORDER — ERGOCALCIFEROL 1.25 MG (50000 UT) PO CAPS: 50000.0000 [IU] | ORAL_CAPSULE | ORAL | 0 refills | Status: DC

## 2020-05-03 NOTE — Assessment & Plan Note (Signed)
Recurrent.  Will resume mega dose for 12 weekly doses.

## 2020-05-03 NOTE — Assessment & Plan Note (Signed)
Her  random glucose is not  elevated but her A1c in the past suggested  she was at risk for developing diabetes.   She has lowered her a1c by following a  low glycemic index diet and is participating occasionally  in an aerobic  exercise activity.    Lab Results  Component Value Date   HGBA1C 5.6 05/02/2020

## 2020-05-03 NOTE — Assessment & Plan Note (Signed)
Current risk using the AHA calculator is 7%.  No evidence of atherosclerosis by imaging studies.  No statin therapy advised  Lab Results  Component Value Date   CHOL 208 (H) 05/02/2020   HDL 59.60 05/02/2020   LDLCALC 120 (H) 05/02/2020   LDLDIRECT 152.0 05/04/2019   TRIG 141.0 05/02/2020   CHOLHDL 3 05/02/2020

## 2020-08-15 ENCOUNTER — Telehealth: Payer: Self-pay | Admitting: Internal Medicine

## 2020-08-15 DIAGNOSIS — Z1231 Encounter for screening mammogram for malignant neoplasm of breast: Secondary | ICD-10-CM

## 2020-08-15 NOTE — Telephone Encounter (Signed)
Mammogram has been ordered. Left pt a detailed message letting her know that it was ordered and she can call [facility] to schedule the appt.

## 2020-08-15 NOTE — Telephone Encounter (Signed)
Patient called stating she has not received a call regarding her mammogram. I don't see and order. She stated she has one every year.

## 2020-08-24 DIAGNOSIS — D225 Melanocytic nevi of trunk: Secondary | ICD-10-CM | POA: Diagnosis not present

## 2020-08-24 DIAGNOSIS — D2262 Melanocytic nevi of left upper limb, including shoulder: Secondary | ICD-10-CM | POA: Diagnosis not present

## 2020-08-24 DIAGNOSIS — Z85828 Personal history of other malignant neoplasm of skin: Secondary | ICD-10-CM | POA: Diagnosis not present

## 2020-08-24 DIAGNOSIS — X32XXXA Exposure to sunlight, initial encounter: Secondary | ICD-10-CM | POA: Diagnosis not present

## 2020-08-24 DIAGNOSIS — L570 Actinic keratosis: Secondary | ICD-10-CM | POA: Diagnosis not present

## 2020-08-24 DIAGNOSIS — L821 Other seborrheic keratosis: Secondary | ICD-10-CM | POA: Diagnosis not present

## 2020-08-24 DIAGNOSIS — D2261 Melanocytic nevi of right upper limb, including shoulder: Secondary | ICD-10-CM | POA: Diagnosis not present

## 2020-08-24 DIAGNOSIS — B351 Tinea unguium: Secondary | ICD-10-CM | POA: Diagnosis not present

## 2020-09-12 ENCOUNTER — Other Ambulatory Visit: Payer: Self-pay | Admitting: Internal Medicine

## 2020-09-27 ENCOUNTER — Other Ambulatory Visit: Payer: Self-pay

## 2020-09-27 ENCOUNTER — Ambulatory Visit
Admission: RE | Admit: 2020-09-27 | Discharge: 2020-09-27 | Disposition: A | Discharge status: Home / Self Care | Payer: 59 | Source: Ambulatory Visit | Attending: Internal Medicine | Admitting: Internal Medicine

## 2020-09-27 DIAGNOSIS — Z1231 Encounter for screening mammogram for malignant neoplasm of breast: Secondary | ICD-10-CM | POA: Diagnosis not present

## 2020-12-19 ENCOUNTER — Other Ambulatory Visit: Payer: Self-pay | Admitting: Internal Medicine

## 2020-12-20 ENCOUNTER — Telehealth: Payer: Self-pay

## 2020-12-20 NOTE — Telephone Encounter (Signed)
PA has been submitted for Prempro on covermymeds.

## 2020-12-24 ENCOUNTER — Emergency Department: Payer: 59

## 2020-12-24 ENCOUNTER — Other Ambulatory Visit: Payer: Self-pay

## 2020-12-24 ENCOUNTER — Emergency Department
Admission: EM | Admit: 2020-12-24 | Discharge: 2020-12-24 | Disposition: A | Discharge status: Home / Self Care | Payer: 59 | Attending: Emergency Medicine | Admitting: Emergency Medicine

## 2020-12-24 DIAGNOSIS — R55 Syncope and collapse: Secondary | ICD-10-CM | POA: Diagnosis not present

## 2020-12-24 DIAGNOSIS — W01198A Fall on same level from slipping, tripping and stumbling with subsequent striking against other object, initial encounter: Secondary | ICD-10-CM | POA: Insufficient documentation

## 2020-12-24 DIAGNOSIS — Y9289 Other specified places as the place of occurrence of the external cause: Secondary | ICD-10-CM | POA: Diagnosis not present

## 2020-12-24 DIAGNOSIS — M79601 Pain in right arm: Secondary | ICD-10-CM | POA: Insufficient documentation

## 2020-12-24 DIAGNOSIS — M25411 Effusion, right shoulder: Secondary | ICD-10-CM | POA: Diagnosis not present

## 2020-12-24 DIAGNOSIS — Z043 Encounter for examination and observation following other accident: Secondary | ICD-10-CM | POA: Diagnosis not present

## 2020-12-24 DIAGNOSIS — S4991XA Unspecified injury of right shoulder and upper arm, initial encounter: Secondary | ICD-10-CM | POA: Diagnosis present

## 2020-12-24 DIAGNOSIS — Y9373 Activity, racquet and hand sports: Secondary | ICD-10-CM | POA: Diagnosis not present

## 2020-12-24 DIAGNOSIS — S42291A Other displaced fracture of upper end of right humerus, initial encounter for closed fracture: Secondary | ICD-10-CM | POA: Insufficient documentation

## 2020-12-24 DIAGNOSIS — S0031XA Abrasion of nose, initial encounter: Secondary | ICD-10-CM | POA: Diagnosis not present

## 2020-12-24 DIAGNOSIS — S42341A Displaced spiral fracture of shaft of humerus, right arm, initial encounter for closed fracture: Secondary | ICD-10-CM | POA: Diagnosis not present

## 2020-12-24 DIAGNOSIS — M2578 Osteophyte, vertebrae: Secondary | ICD-10-CM | POA: Diagnosis not present

## 2020-12-24 DIAGNOSIS — S42201A Unspecified fracture of upper end of right humerus, initial encounter for closed fracture: Secondary | ICD-10-CM | POA: Diagnosis not present

## 2020-12-24 DIAGNOSIS — W19XXXA Unspecified fall, initial encounter: Secondary | ICD-10-CM

## 2020-12-24 DIAGNOSIS — S42351A Displaced comminuted fracture of shaft of humerus, right arm, initial encounter for closed fracture: Secondary | ICD-10-CM | POA: Diagnosis not present

## 2020-12-24 MED ORDER — HYDROMORPHONE HCL 1 MG/ML IJ SOLN
0.5000 mg | Freq: Once | INTRAMUSCULAR | Status: AC
  Administered 2020-12-24: 0.5 mg via INTRAMUSCULAR
  Filled 2020-12-24: qty 1

## 2020-12-24 MED ORDER — ONDANSETRON 4 MG PO TBDP: 4.0000 mg | ORAL_TABLET | Freq: Three times a day (TID) | ORAL | 0 refills | Status: DC | PRN

## 2020-12-24 MED ORDER — OXYCODONE-ACETAMINOPHEN 5-325 MG PO TABS: 1.0000 | ORAL_TABLET | Freq: Four times a day (QID) | ORAL | 0 refills | Status: AC | PRN

## 2020-12-24 MED ORDER — MORPHINE SULFATE (PF) 4 MG/ML IV SOLN
4.0000 mg | Freq: Once | INTRAVENOUS | Status: AC
  Administered 2020-12-24: 4 mg via INTRAMUSCULAR
  Filled 2020-12-24: qty 1

## 2020-12-24 MED ORDER — METHOCARBAMOL 750 MG PO TABS: 750.0000 mg | ORAL_TABLET | Freq: Four times a day (QID) | ORAL | 0 refills | Status: AC | PRN

## 2020-12-24 NOTE — ED Notes (Signed)
See triage note  Presents s/p fall  States she fell while playing pickle ball  Having pain to right shoulder/upper arm  Good pulse  Positive deformity noted

## 2020-12-24 NOTE — ED Triage Notes (Signed)
Pt comes with c/o fall while playing pickle ball. Husband reports pt injured her right arm and shoulder. Pt states she tripped and fell. Pt states no LOC or blood thinners. Pt does have abrasion to forehead and bridge of nose.

## 2020-12-24 NOTE — ED Provider Notes (Signed)
[facility] Emergency Department Provider Note  ____________________________________________   Event Date/Time   First MD Initiated Contact with Patient 12/24/20 1012     (approximate)  I have reviewed the triage vital signs and the nursing notes.   HISTORY  Chief Complaint Fall  HPI Ashley Ward is a 70 y.o. female who presents to the emergency department for evaluation of right arm pain and injury.  Patient states that she was playing pickle ball when she was making a move towards the net and somehow got tripped.  She is unsure of exactly how she fell, but states that her head and face did hit the net.  She denies any loss of consciousness at the time, but states that she has felt near syncopal multiple times since then secondary to pain.  She denies any use of blood thinners.  She has been unable to move her right arm since that time and is guarding it/holding it with her left upper extremity.         Past Medical History:  Diagnosis Date   Hyperlipidemia    Menopause    Menopause     Patient Active Problem List   Diagnosis Date Noted   Encounter for general adult medical examination with abnormal findings 05/02/2020   Radial head fracture, closed 11/09/2019   Epigastric pain 11/09/2019   Vitamin D deficiency 11/09/2019   Prediabetes 05/05/2019   Insomnia due to psychological stress 04/01/2019   Family history of breast cancer in first degree relative 07/01/2016   Major depressive disorder, recurrent episode with melancholic features ([place]) 78/24/2353   Encounter for preventive health examination 05/13/2013   Screening for colon cancer 04/26/2012   Overweight (BMI 25.0-29.9) 01/05/2012   Hyperlipidemia LDL goal <160 07/08/2011   Osteopenia 07/08/2011   Menopause     Past Surgical History:  Procedure Laterality Date   DILATION AND CURETTAGE, DIAGNOSTIC / THERAPEUTIC  2007   [place],  for spotting, fresolved    Prior to Admission  medications   Medication Sig Start Date End Date Taking? Authorizing Provider  methocarbamol (ROBAXIN-750) 750 MG tablet Take 1 tablet (750 mg total) by mouth 4 (four) times daily as needed for up to 10 days for muscle spasms. 12/24/20 01/03/21 Yes Crystina Borrayo, Farrel Gordon, PA  ondansetron (ZOFRAN ODT) 4 MG disintegrating tablet Take 1 tablet (4 mg total) by mouth every 8 (eight) hours as needed for nausea or vomiting. 12/24/20  Yes Marlana Salvage, PA  oxyCODONE-acetaminophen (PERCOCET) 5-325 MG tablet Take 1 tablet by mouth every 6 (six) hours as needed for up to 5 days for severe pain. 12/24/20 12/29/20 Yes Melat Wrisley, Farrel Gordon, PA  buPROPion (WELLBUTRIN XL) 150 MG 24 hr tablet TAKE ONE TABLET EVERY DAY ALONG WITH 300MG FOR DAILY DOSE OF 450MG 09/12/20   Crecencio Mc, MD  buPROPion (WELLBUTRIN XL) 300 MG 24 hr tablet TAKE ONE TABLET EVERY DAY ALONG WITH 150MG FOR DAILY DOSE OF 450MG 09/12/20   Crecencio Mc, MD  ergocalciferol (VITAMIN D2) 1.25 MG (50000 UT) capsule Take 1 capsule (50,000 Units total) by mouth once a week. 05/03/20   Crecencio Mc, MD  escitalopram (LEXAPRO) 10 MG tablet Take 1 tablet (10 mg total) by mouth daily. 05/02/20   Crecencio Mc, MD  Multiple Vitamins-Minerals (PRESERVISION AREDS PO) Take 2 capsules by mouth daily.    [provider]  omeprazole (PRILOSEC) 40 MG capsule Take 1 capsule (40 mg total) by mouth daily. 11/09/19  Crecencio Mc, MD  PREMPRO 0.3-1.5 MG tablet TAKE 1 TABLET BY MOUTH DAILY 12/19/20   Crecencio Mc, MD  traZODone (DESYREL) 50 MG tablet Take 0.5-1 tablets (25-50 mg total) by mouth at bedtime as needed for sleep. Increase as needed max dose 100 mg daily 04/01/19   Crecencio Mc, MD    Allergies Penicillins and Asa [aspirin]  Family History  Problem Relation Age of Onset   Diabetes Mother    Cancer Mother        lymphoma., in remission   COPD Father    Cancer Sister        half sister , BRCA, negative genetic testing   Alcohol  abuse Sister    Breast cancer Sister 56   Breast cancer Cousin     Social History Social History   Tobacco Use   Smoking status: Never   Smokeless tobacco: Never  Vaping Use   Vaping Use: Never used  Substance Use Topics   Alcohol use: Yes    Alcohol/week: 4.0 standard drinks    Types: 4 Glasses of wine per week   Drug use: No    Review of Systems Constitutional: No fever/chills Eyes: No visual changes. ENT: No sore throat. Cardiovascular: Denies chest pain. Respiratory: Denies shortness of breath. Gastrointestinal: No abdominal pain.  No nausea, no vomiting.  No diarrhea.  No constipation. Genitourinary: Negative for dysuria. Musculoskeletal: + Right arm pain Skin: Negative for rash. Neurological: Negative for headaches, focal weakness or numbness.  ____________________________________________   PHYSICAL EXAM:  VITAL SIGNS: ED Triage Vitals  Enc Vitals Group     BP 12/24/20 0930 (!) 128/96     Pulse Rate 12/24/20 0930 79     Resp 12/24/20 0930 18     Temp 12/24/20 0930 97.8 F (36.6 C)     Temp Source 12/24/20 0930 Oral     SpO2 12/24/20 0930 100 %     Weight 12/24/20 1013 182 lb 8.7 oz (82.8 kg)     Height 12/24/20 1013 5' 5.75" (1.67 m)     Head Circumference --      Peak Flow --      Pain Score 12/24/20 0930 8     Pain Loc --      Pain Edu? --      Excl. in [place]? --    Constitutional: Alert and oriented.  Well nourished, in obvious pain. Eyes: Conjunctivae are normal. PERRL. EOMI. Head:  Mild abrasion over the nose, otherwise atraumatic. Nose: No congestion/rhinnorhea.  Mild abrasion as above. Mouth/Throat: Mucous membranes are moist.  Oropharynx non-erythematous. Neck: No stridor.  No tenderness to palpation of the midline or paraspinals of the cervical spine.  There is tenderness to the right trapezius musculature likely secondary to right arm injury.  Full range of motion of the cervical spine. Cardiovascular: Normal rate, regular rhythm. Grossly  normal heart sounds.  Good peripheral circulation. Respiratory: Normal respiratory effort.  No retractions. Lungs CTAB. Gastrointestinal: Soft and nontender. No distention. No abdominal bruits. No CVA tenderness. Musculoskeletal: Patient noted to have significant guarding of the right arm, causing it into the left.  There is gross tenderness about the humeral region.  No tenderness to the forearm, wrist or digits.  Radial pulse is strong and symmetric, 2+.  Capillary refill less than 3 seconds all digits.  She is neurologically intact of the right upper extremity, able to move the wrist and digits freely.  Neurologic:  Normal speech and language. No gross  focal neurologic deficits are appreciated. No gait instability. Skin:  Skin is warm, dry and intact. No rash noted. Psychiatric: Mood and affect are normal. Speech and behavior are normal.   ____________________________________________  RADIOLOGY I, Marlana Salvage, personally viewed and evaluated these images (plain radiographs) as part of my medical decision making, as well as reviewing the written report by the radiologist.  ED provider interpretation: Spiral fracture noted of the right humerus, see radiology report for CT findings  Official radiology report(s): CT Head Wo Contrast  Result Date: 12/24/2020 CLINICAL DATA:  Status post fall. EXAM: CT HEAD WITHOUT CONTRAST CT MAXILLOFACIAL WITHOUT CONTRAST CT CERVICAL SPINE WITHOUT CONTRAST TECHNIQUE: Multidetector CT imaging of the head, cervical spine, and maxillofacial structures were performed using the standard protocol without intravenous contrast. Multiplanar CT image reconstructions of the cervical spine and maxillofacial structures were also generated. COMPARISON:  None. FINDINGS: CT HEAD FINDINGS Brain: No evidence of acute infarction, hemorrhage, hydrocephalus, extra-axial collection or mass lesion/mass effect. Remote left basal ganglia lacunar infarct, image 15/2. Vascular: No  hyperdense vessel or unexpected calcification. Skull: Normal. Negative for fracture or focal lesion. Other: None CT MAXILLOFACIAL FINDINGS Osseous: No fracture or mandibular dislocation. No destructive process. Orbits: Negative. No traumatic or inflammatory finding. Sinuses: Mild mucosal thickening involving the floor the left maxillary sinus. Soft tissues: Negative. CT CERVICAL SPINE FINDINGS Alignment: Normal. Skull base and vertebrae: No acute fracture. No primary bone lesion or focal pathologic process. Soft tissues and spinal canal: No prevertebral fluid or swelling. No visible canal hematoma. Disc levels: Large, flowing ventral syndesmophytes are identified with relative preservation of the disc spaces. Upper chest: Negative. Other: None IMPRESSION: 1. No acute intracranial abnormalities. 2. Remote left basal ganglia lacunar infarct. 3. No evidence for facial bone fracture. 4. No evidence for cervical spine fracture or dislocation. 5. Large, flowing ventral syndesmophytes compatible with DISH. Electronically Signed   By: Kerby Moors M.D.   On: 12/24/2020 10:43   CT Cervical Spine Wo Contrast  Result Date: 12/24/2020 CLINICAL DATA:  Status post fall. EXAM: CT HEAD WITHOUT CONTRAST CT MAXILLOFACIAL WITHOUT CONTRAST CT CERVICAL SPINE WITHOUT CONTRAST TECHNIQUE: Multidetector CT imaging of the head, cervical spine, and maxillofacial structures were performed using the standard protocol without intravenous contrast. Multiplanar CT image reconstructions of the cervical spine and maxillofacial structures were also generated. COMPARISON:  None. FINDINGS: CT HEAD FINDINGS Brain: No evidence of acute infarction, hemorrhage, hydrocephalus, extra-axial collection or mass lesion/mass effect. Remote left basal ganglia lacunar infarct, image 15/2. Vascular: No hyperdense vessel or unexpected calcification. Skull: Normal. Negative for fracture or focal lesion. Other: None CT MAXILLOFACIAL FINDINGS Osseous: No fracture  or mandibular dislocation. No destructive process. Orbits: Negative. No traumatic or inflammatory finding. Sinuses: Mild mucosal thickening involving the floor the left maxillary sinus. Soft tissues: Negative. CT CERVICAL SPINE FINDINGS Alignment: Normal. Skull base and vertebrae: No acute fracture. No primary bone lesion or focal pathologic process. Soft tissues and spinal canal: No prevertebral fluid or swelling. No visible canal hematoma. Disc levels: Large, flowing ventral syndesmophytes are identified with relative preservation of the disc spaces. Upper chest: Negative. Other: None IMPRESSION: 1. No acute intracranial abnormalities. 2. Remote left basal ganglia lacunar infarct. 3. No evidence for facial bone fracture. 4. No evidence for cervical spine fracture or dislocation. 5. Large, flowing ventral syndesmophytes compatible with DISH. Electronically Signed   By: Kerby Moors M.D.   On: 12/24/2020 10:43   CT HUMERUS RIGHT WO CONTRAST  Result Date: 12/24/2020 CLINICAL DATA:  Pickle ball injury.  Proximal humeral fracture. EXAM: CT OF THE RIGHT HUMERUS WITHOUT CONTRAST TECHNIQUE: Multidetector CT imaging was performed according to the standard protocol. Multiplanar CT image reconstructions were also generated. COMPARISON:  Radiographs same date. FINDINGS: Bones/Joint/Cartilage Comminuted and displaced fracture of the proximal to mid humeral diaphysis is associated with a large medially displaced butterfly fragment. The distal fragment is moderately displaced posterolaterally by up to 1.7 cm. Proximally, this fracture extends into both humeral tuberosities which are nondisplaced. There is no involvement of the humeral head articular surface. The humerus is located. There is moderate size shoulder joint effusion. The visualized right scapula and clavicle are intact. Ligaments Suboptimally assessed by CT. Muscles and Tendons Contusion within the deltoid muscle anteriorly. No foreign body or soft tissue  emphysema. Soft tissues Anterior subcutaneous edema. No foreign body or soft tissue emphysema. IMPRESSION: 1. Comminuted and moderately displaced fracture of the proximal right humerus as described. This fracture extends proximally into both tuberosities, but there is no involvement of the humeral head articular surface. 2. Moderate size shoulder joint effusion. 3. No evidence of scapular injury or dislocation. Electronically Signed   By: Richardean Sale M.D.   On: 12/24/2020 14:01   DG Humerus Right  Result Date: 12/24/2020 CLINICAL DATA:  Fall with arm pain. EXAM: RIGHT HUMERUS - 2+ VIEW COMPARISON:  None. FINDINGS: Two-view exam shows a comminuted spiral fracture of the proximal right humerus extending from the surgical neck distally to the level of the mid diaphysis near the junction of the middle and distal thirds. Acromioclavicular and coracoclavicular distances are preserved. Bones are diffusely demineralized. IMPRESSION: Comminuted spiral fracture of the proximal right humerus. Electronically Signed   By: Misty Stanley M.D.   On: 12/24/2020 10:22   CT Maxillofacial Wo Contrast  Result Date: 12/24/2020 CLINICAL DATA:  Status post fall. EXAM: CT HEAD WITHOUT CONTRAST CT MAXILLOFACIAL WITHOUT CONTRAST CT CERVICAL SPINE WITHOUT CONTRAST TECHNIQUE: Multidetector CT imaging of the head, cervical spine, and maxillofacial structures were performed using the standard protocol without intravenous contrast. Multiplanar CT image reconstructions of the cervical spine and maxillofacial structures were also generated. COMPARISON:  None. FINDINGS: CT HEAD FINDINGS Brain: No evidence of acute infarction, hemorrhage, hydrocephalus, extra-axial collection or mass lesion/mass effect. Remote left basal ganglia lacunar infarct, image 15/2. Vascular: No hyperdense vessel or unexpected calcification. Skull: Normal. Negative for fracture or focal lesion. Other: None CT MAXILLOFACIAL FINDINGS Osseous: No fracture or  mandibular dislocation. No destructive process. Orbits: Negative. No traumatic or inflammatory finding. Sinuses: Mild mucosal thickening involving the floor the left maxillary sinus. Soft tissues: Negative. CT CERVICAL SPINE FINDINGS Alignment: Normal. Skull base and vertebrae: No acute fracture. No primary bone lesion or focal pathologic process. Soft tissues and spinal canal: No prevertebral fluid or swelling. No visible canal hematoma. Disc levels: Large, flowing ventral syndesmophytes are identified with relative preservation of the disc spaces. Upper chest: Negative. Other: None IMPRESSION: 1. No acute intracranial abnormalities. 2. Remote left basal ganglia lacunar infarct. 3. No evidence for facial bone fracture. 4. No evidence for cervical spine fracture or dislocation. 5. Large, flowing ventral syndesmophytes compatible with DISH. Electronically Signed   By: Kerby Moors M.D.   On: 12/24/2020 10:43    ____________________________________________   PROCEDURES  Procedure(s) performed (including Critical Care):  .Ortho Injury Treatment  Date/Time: 12/25/2020 7:35 AM Performed by: Marlana Salvage, PA Authorized by: Marlana Salvage, PA   Consent:    Consent obtained:  Verbal   Consent given by:  Patient   Risks discussed:  Stiffness, restricted joint movement and fracture   Alternatives discussed:  No treatment and referralInjury location: upper arm Location details: right upper arm Injury type: fracture Fracture type: humeral shaft Pre-procedure neurovascular assessment: neurovascularly intact Pre-procedure distal perfusion: normal Pre-procedure neurological function: normal Pre-procedure range of motion: reduced  Anesthesia: Local anesthesia used: no  Patient sedated: NoManipulation performed: no Immobilization: splint Splint type: Coaptation splint with sling. Splint Applied by: ED Provider and ED Tech Supplies used: cotton padding, elastic bandage and  Ortho-Glass Post-procedure neurovascular assessment: post-procedure neurovascularly intact Post-procedure distal perfusion: normal Post-procedure neurological function: normal Post-procedure range of motion: unchanged     ____________________________________________   INITIAL IMPRESSION / ASSESSMENT AND PLAN / ED COURSE  As part of my medical decision making, I reviewed the following data within the [place] notes reviewed and incorporated, Radiograph reviewed, A consult was requested and obtained from this/these consultant(s) Orthopedics, Notes from prior ED visits, and [place] Controlled Substance Database        Patient is a 70 year old female who presents to the emergency department for evaluation of right arm pain, facial injury after tripping while playing pickle ball.  See HPI for further details.  In triage, the patient has normal vital signs.  On physical exam, she is guarding the right arm with significant amount of pain.  She is neurologically intact, no tenderness of the cervical spine.  Given the patient's age and mechanism, CT of the face head and neck was obtained and is negative for acute fracture or pathology.  X-ray of the right humerus was obtained and demonstrates a comminuted humeral shaft spiral fracture.  Dr. Sharlet Salina of on-call orthopedics was consulted on the patient, and advises placing her in a coaptation splint with a sling with close follow-up on Wednesday to discuss options for treatment.  He also recommends obtaining a CT scan given the comminution for their further evaluation.  This was obtained.  Patient was placed in a coaptation splint by myself with the assistance of an ER tech and with associated sling.  This required multiple doses of pain medication for the patient to tolerate.  She remains neurovascularly intact after placement of the splint.  Discussed the plan with the patient including discharge to home with close outpatient  follow-up and the patient is amenable with this plan.  Patient was given Percocet as well as muscle relaxant for her pain and instructed on their use.  Patient is stable at this time for outpatient follow-up.      ____________________________________________   FINAL CLINICAL IMPRESSION(S) / ED DIAGNOSES  Final diagnoses:  Closed displaced spiral fracture of shaft of right humerus, initial encounter  Fall, initial encounter     ED Discharge Orders          Ordered    methocarbamol (ROBAXIN-750) 750 MG tablet  4 times daily PRN        12/24/20 1334    oxyCODONE-acetaminophen (PERCOCET) 5-325 MG tablet  Every 6 hours PRN        12/24/20 1334    ondansetron (ZOFRAN ODT) 4 MG disintegrating tablet  Every 8 hours PRN        12/24/20 1402             Note:  This document was prepared using Dragon voice recognition software and may include unintentional dictation errors.    Marlana Salvage, PA 12/25/20 [id number]    Duffy Bruce, MD 12/27/20 [phone]

## 2020-12-24 NOTE — Discharge Instructions (Addendum)
Please call [facility] to schedule follow up appointment on Wednesday with Dr. Sharlet Salina. You may also use the prescribed robaxin (muscle relaxant) and Percocet as directed. If you take 1 Percocet, you may take up to an additional 650 mg of Tylenol. If you require 2 percocet to control your pain, please do not take any additional Tylenol.  Return to the Emergency Department if you experience any worsening in symptoms.

## 2020-12-26 DIAGNOSIS — S42201A Unspecified fracture of upper end of right humerus, initial encounter for closed fracture: Secondary | ICD-10-CM | POA: Diagnosis not present

## 2021-01-03 DIAGNOSIS — S42201A Unspecified fracture of upper end of right humerus, initial encounter for closed fracture: Secondary | ICD-10-CM | POA: Diagnosis not present

## 2021-01-23 DIAGNOSIS — S42201A Unspecified fracture of upper end of right humerus, initial encounter for closed fracture: Secondary | ICD-10-CM | POA: Diagnosis not present

## 2021-02-05 ENCOUNTER — Other Ambulatory Visit: Payer: Self-pay | Admitting: Internal Medicine

## 2021-02-05 MED ORDER — ESTRADIOL-NORETHINDRONE ACET 1-0.5 MG PO TABS: 1.0000 | ORAL_TABLET | Freq: Every day | ORAL | 12 refills | Status: DC

## 2021-02-13 DIAGNOSIS — S42201A Unspecified fracture of upper end of right humerus, initial encounter for closed fracture: Secondary | ICD-10-CM | POA: Diagnosis not present

## 2021-02-20 DIAGNOSIS — S42201D Unspecified fracture of upper end of right humerus, subsequent encounter for fracture with routine healing: Secondary | ICD-10-CM | POA: Diagnosis not present

## 2021-02-26 DIAGNOSIS — S42201D Unspecified fracture of upper end of right humerus, subsequent encounter for fracture with routine healing: Secondary | ICD-10-CM | POA: Diagnosis not present

## 2021-03-04 DIAGNOSIS — S42201D Unspecified fracture of upper end of right humerus, subsequent encounter for fracture with routine healing: Secondary | ICD-10-CM | POA: Diagnosis not present

## 2021-03-11 DIAGNOSIS — S42201D Unspecified fracture of upper end of right humerus, subsequent encounter for fracture with routine healing: Secondary | ICD-10-CM | POA: Diagnosis not present

## 2021-03-12 ENCOUNTER — Ambulatory Visit (INDEPENDENT_AMBULATORY_CARE_PROVIDER_SITE_OTHER): Payer: 59

## 2021-03-12 VITALS — Ht 65.75 in | Wt 182.0 lb

## 2021-03-12 DIAGNOSIS — Z0000 Encounter for general adult medical examination without abnormal findings: Secondary | ICD-10-CM | POA: Diagnosis not present

## 2021-03-12 NOTE — Progress Notes (Signed)
Subjective:   Ashley Ward is a 70 y.o. female who presents for Medicare Annual (Subsequent) preventive examination.  Review of Systems    No ROS.  Medicare Wellness Virtual Visit.  Visual/audio telehealth visit, UTA vital signs.   See social history for additional risk factors.   Cardiac Risk Factors include: advanced age (>12mn, >>66women)     Objective:    Today's Vitals   03/12/21 0904  Weight: 182 lb (82.6 kg)  Height: 5' 5.75" (1.67 m)   Body mass index is 29.6 kg/m.  Advanced Directives 03/12/2021 12/24/2020 03/09/2020 03/09/2019  Does Patient Have a Medical Advance Directive? Yes No No No  Type of Advance Directive [place] Does patient want to make changes to medical advance directive? No - Patient declined - - -  Copy of [place] Chart? No - copy requested - - -  Would patient like information on creating a medical advance directive? - - No - Patient declined Yes (MAU/Ambulatory/Procedural Areas - Information given)    Current Medications (verified) Outpatient Encounter Medications as of 03/12/2021  Medication Sig   buPROPion (WELLBUTRIN XL) 150 MG 24 hr tablet TAKE ONE TABLET EVERY DAY ALONG WITH 300MG FOR DAILY DOSE OF 450MG   buPROPion (WELLBUTRIN XL) 300 MG 24 hr tablet TAKE ONE TABLET EVERY DAY ALONG WITH 150MG FOR DAILY DOSE OF 450MG   escitalopram (LEXAPRO) 10 MG tablet Take 1 tablet (10 mg total) by mouth daily.   estradiol-norethindrone (ACTIVELLA) 1-0.5 MG tablet Take 1 tablet by mouth daily.   Multiple Vitamins-Minerals (PRESERVISION AREDS PO) Take 2 capsules by mouth daily.   omeprazole (PRILOSEC) 40 MG capsule Take 1 capsule (40 mg total) by mouth daily.   traZODone (DESYREL) 50 MG tablet Take 0.5-1 tablets (25-50 mg total) by mouth at bedtime as needed for sleep. Increase as needed max dose 100 mg daily   [DISCONTINUED] ergocalciferol (VITAMIN D2) 1.25 MG (50000 UT) capsule Take 1 capsule (50,000 Units  total) by mouth once a week.   [DISCONTINUED] ondansetron (ZOFRAN ODT) 4 MG disintegrating tablet Take 1 tablet (4 mg total) by mouth every 8 (eight) hours as needed for nausea or vomiting.   No facility-administered encounter medications on file as of 03/12/2021.    Allergies (verified) Penicillins and Asa [aspirin]   History: Past Medical History:  Diagnosis Date   Hyperlipidemia    Menopause    Menopause    Past Surgical History:  Procedure Laterality Date   DILATION AND CURETTAGE, DIAGNOSTIC / THERAPEUTIC  2007   [place],  for spotting, fresolved   Family History  Problem Relation Age of Onset   Diabetes Mother    Cancer Mother        lymphoma., in remission   COPD Father    Cancer Sister        half sister , BRCA, negative genetic testing   Alcohol abuse Sister    Breast cancer Sister 466  Breast cancer Cousin    Social History   Socioeconomic History   Marital status: Married    Spouse name: Not on file   Number of children: Not on file   Years of education: Not on file   Highest education level: Not on file  Occupational History   Not on file  Tobacco Use   Smoking status: Never   Smokeless tobacco: Never  Vaping Use   Vaping Use: Never used  Substance and Sexual Activity  Alcohol use: Yes    Alcohol/week: 4.0 standard drinks    Types: 4 Glasses of wine per week   Drug use: No   Sexual activity: Yes  Other Topics Concern   Not on file  Social History Narrative   Not on file   Social Determinants of Health   Financial Resource Strain: Low Risk    Difficulty of Paying Living Expenses: Not hard at all  Food Insecurity: No Food Insecurity   Worried About Charity fundraiser in the Last Year: Never true   [place] in the Last Year: Never true  Transportation Needs: No Transportation Needs   Lack of Transportation (Medical): No   Lack of Transportation (Non-Medical): No  Physical Activity: Insufficiently Active   Days of Exercise per Week:  2 days   Minutes of Exercise per Session: 60 min  Stress: No Stress Concern Present   Feeling of Stress : Not at all  Social Connections: Unknown   Frequency of Communication with Friends and Family: More than three times a week   Frequency of Social Gatherings with Friends and Family: More than three times a week   Attends Religious Services: Not on Electrical engineer or Organizations: Not on file   Attends Archivist Meetings: Not on file   Marital Status: Not on file    Tobacco Counseling Counseling given: Not Answered   Clinical Intake:  Pre-visit preparation completed: Yes        Diabetes: No  Interpreter Needed?: No      Activities of Daily Living In your present state of health, do you have any difficulty performing the following activities: 03/12/2021  Hearing? N  Vision? N  Difficulty concentrating or making decisions? N  Walking or climbing stairs? N  Dressing or bathing? N  Doing errands, shopping? N  Preparing Food and eating ? N  Using the Toilet? N  In the past six months, have you accidently leaked urine? N  Do you have problems with loss of bowel control? N  Managing your Medications? N  Managing your Finances? N  Housekeeping or managing your Housekeeping? N  Some recent data might be hidden    Patient Care Team: Crecencio Mc, MD as PCP - General (Internal Medicine)  Indicate any recent Medical Services you may have received from other than [facility] providers in the past year (date may be approximate).     Assessment:   This is a routine wellness examination for Ashley Ward.  I connected with Ashley Ward today by telephone and verified that I am speaking with the correct person using two identifiers. Location patient: home Location provider: work Persons participating in the virtual visit: patient, Marine scientist.    I discussed the limitations, risks, security and privacy concerns of performing an evaluation and management service by  telephone and the availability of in person appointments. The patient expressed understanding and verbally consented to this telephonic visit.    Interactive audio and video telecommunications were attempted between this provider and patient, however failed, due to patient having technical difficulties OR patient did not have access to video capability.  We continued and completed visit with audio only.  Some vital signs may be absent or patient reported.   Hearing/Vision screen Hearing Screening - Comments:: Patient is able to hear conversational tones without difficulty.  No issues reported. Vision Screening - Comments:: Wears corrective lenses  They have seen their ophthalmologist in the last 12 months.  Dietary issues and exercise activities discussed: Current Exercise Habits: Home exercise routine;Structured exercise class (Physical therapy), Time (Minutes): 60, Frequency (Times/Week): 2, Weekly Exercise (Minutes/Week): 120, Intensity: Mild Regular diet Patient is trying to eat healthy with portion control servings. Plans to increase water intake.     Goals Addressed               This Visit's Progress     Patient Stated     Weight (lb) < 170 lb (77.1 kg) (pt-stated)   182 lb (82.6 kg)     Walk more for exercise Increase water intake Portion control meals      Other     Healthy lifestyle        Stay active  Healthy diet        Depression Screen PHQ 2/9 Scores 03/12/2021 05/02/2020 03/09/2020 11/08/2019 03/09/2019 03/22/2018 06/30/2016  PHQ - 2 Score 0 2 0 1 1 4 6   PHQ- 9 Score - 8 - 4 4 18 14     Fall Risk Fall Risk  03/12/2021 05/02/2020 03/09/2020 05/04/2019 03/09/2019  Falls in the past year? 1 1 0 0 0  Number falls in past yr: 1 0 0 - 0  Injury with Fall? 1 1 - - 0  Follow up Falls evaluation completed Falls evaluation completed Falls evaluation completed Falls evaluation completed -    FALL RISK PREVENTION PERTAINING TO THE HOME: Adequate lighting in your home  to reduce risk of falls? Yes   ASSISTIVE DEVICES UTILIZED TO PREVENT FALLS: Use of a cane, walker or w/c? No   TIMED UP AND GO: Was the test performed? No .   Cognitive Function:  Patient is alert and oriented x3.  Enjoys being active and engaging in brain stimulation activities.  MMSE/6CIT deferred. Normal by direct communication/observation.    6CIT Screen 03/09/2019  What Year? 0 points  What month? 0 points  What time? 0 points  Count back from 20 0 points  Months in reverse 0 points  Repeat phrase 0 points  Total Score 0    Immunizations Immunization History  Administered Date(s) Administered   Influenza Split 03/20/2014, 03/28/2015, 03/26/2016   Influenza, High Dose Seasonal PF 03/29/2019   Influenza-Unspecified 03/16/2013, 03/18/2018, 04/18/2020   PFIZER(Purple Top)SARS-COV-2 Vaccination 07/15/2019, 08/05/2019, 03/16/2020   Pneumococcal Conjugate-13 03/26/2016   Pneumococcal Polysaccharide-23 04/12/2019   Tdap 04/26/2012, 03/20/2014   Shingrix vaccine- Due, Education has been provided regarding the importance of this vaccine. Advised may receive this vaccine at local pharmacy or Health Dept. Aware to provide a copy of the vaccination record if obtained from local pharmacy or Health Dept. Verbalized acceptance and understanding. Deferred.   Health Maintenance Health Maintenance  Topic Date Due   COVID-19 Vaccine (4 - Booster for Pfizer series) 03/28/2021 (Originally 07/17/2020)   Zoster Vaccines- Shingrix (1 of 2) 06/11/2021 (Originally 01/24/2001)   INFLUENZA VACCINE  09/13/2021 (Originally 01/14/2021)   MAMMOGRAM  09/27/2021   TETANUS/TDAP  03/20/2024   COLONOSCOPY (Pts 45-32yr Insurance coverage will need to be confirmed)  07/01/2026   DEXA SCAN  Completed   Hepatitis C Screening  Completed   HPV VACCINES  Aged Out   Lung Cancer Screening: (Low Dose CT Chest recommended if Age 770-80years, 30 pack-year currently smoking OR have quit w/in 15years.) does not  qualify.   Vision Screening: Recommended annual ophthalmology exams for early detection of glaucoma and other disorders of the eye.  Dental Screening: Recommended annual dental exams for proper oral hygiene  Community  Resource Referral / Chronic Care Management: CRR required this visit?  No   CCM required this visit?  No      Plan:   Keep all routine maintenance appointments.   I have personally reviewed and noted the following in the patient's chart:   Medical and social history Use of alcohol, tobacco or illicit drugs  Current medications and supplements including opioid prescriptions. Not taking opioid.  Functional ability and status Nutritional status Physical activity Advanced directives List of other physicians Hospitalizations, surgeries, and ER visits in previous 12 months Vitals Screenings to include cognitive, depression, and falls Referrals and appointments  In addition, I have reviewed and discussed with patient certain preventive protocols, quality metrics, and best practice recommendations. A written personalized care plan for preventive services as well as general preventive health recommendations were provided to patient via mychart.     Varney Biles, LPN   [id number]

## 2021-03-12 NOTE — Patient Instructions (Addendum)
Ashley Ward , Thank you for taking time to come for your Medicare Wellness Visit. I appreciate your ongoing commitment to your health goals. Please review the following plan we discussed and let me know if I can assist you in the future.   These are the goals we discussed:  Goals       Patient Stated     Weight (lb) < 170 lb (77.1 kg) (pt-stated)      Walk more for exercise Increase water intake Portion control meals      Other     Healthy lifestyle      Stay active  Healthy diet         This is a list of the screening recommended for you and due dates:  Health Maintenance  Topic Date Due   COVID-19 Vaccine (4 - Booster for Pfizer series) 03/28/2021*   Zoster (Shingles) Vaccine (1 of 2) 06/11/2021*   Flu Shot  09/13/2021*   Mammogram  09/27/2021   Tetanus Vaccine  03/20/2024   Colon Cancer Screening  07/01/2026   DEXA scan (bone density measurement)  Completed   Hepatitis C Screening: USPSTF Recommendation to screen - Ages 67-79 yo.  Completed   HPV Vaccine  Aged Out  *Topic was postponed. The date shown is not the original due date.    Advanced directives: End of life planning; Advance aging; Advanced directives discussed.  Copy of current HCPOA/Living Will requested.    Conditions/risks identified: none new  Follow up in one year for your annual wellness visit    Preventive Care 65 Years and Older, Female Preventive care refers to lifestyle choices and visits with your health care provider that can promote health and wellness. What does preventive care include? A yearly physical exam. This is also called an annual well check. Dental exams once or twice a year. Routine eye exams. Ask your health care provider how often you should have your eyes checked. Personal lifestyle choices, including: Daily care of your teeth and gums. Regular physical activity. Eating a healthy diet. Avoiding tobacco and drug use. Limiting alcohol use. Practicing safe sex. Taking  low-dose aspirin every day. Taking vitamin and mineral supplements as recommended by your health care provider. What happens during an annual well check? The services and screenings done by your health care provider during your annual well check will depend on your age, overall health, lifestyle risk factors, and family history of disease. Counseling  Your health care provider may ask you questions about your: Alcohol use. Tobacco use. Drug use. Emotional well-being. Home and relationship well-being. Sexual activity. Eating habits. History of falls. Memory and ability to understand (cognition). Work and work Statistician. Reproductive health. Screening  You may have the following tests or measurements: Height, weight, and BMI. Blood pressure. Lipid and cholesterol levels. These may be checked every 5 years, or more frequently if you are over 79 years old. Skin check. Lung cancer screening. You may have this screening every year starting at age 67 if you have a 30-pack-year history of smoking and currently smoke or have quit within the past 15 years. Fecal occult blood test (FOBT) of the stool. You may have this test every year starting at age 36. Flexible sigmoidoscopy or colonoscopy. You may have a sigmoidoscopy every 5 years or a colonoscopy every 10 years starting at age 33. Hepatitis C blood test. Hepatitis B blood test. Sexually transmitted disease (STD) testing. Diabetes screening. This is done by checking your blood sugar (glucose) after you  have not eaten for a while (fasting). You may have this done every 1-3 years. Bone density scan. This is done to screen for osteoporosis. You may have this done starting at age 43. Mammogram. This may be done every 1-2 years. Talk to your health care provider about how often you should have regular mammograms. Talk with your health care provider about your test results, treatment options, and if necessary, the need for more tests. Vaccines   Your health care provider may recommend certain vaccines, such as: Influenza vaccine. This is recommended every year. Tetanus, diphtheria, and acellular pertussis (Tdap, Td) vaccine. You may need a Td booster every 10 years. Zoster vaccine. You may need this after age 80. Pneumococcal 13-valent conjugate (PCV13) vaccine. One dose is recommended after age 75. Pneumococcal polysaccharide (PPSV23) vaccine. One dose is recommended after age 90. Talk to your health care provider about which screenings and vaccines you need and how often you need them. This information is not intended to replace advice given to you by your health care provider. Make sure you discuss any questions you have with your health care provider. Document Released: 06/29/2015 Document Revised: 02/20/2016 Document Reviewed: 04/03/2015 Elsevier Interactive Patient Education  2017 [place] Prevention in the Home Falls can cause injuries. They can happen to people of all ages. There are many things you can do to make your home safe and to help prevent falls. What can I do on the outside of my home? Regularly fix the edges of walkways and driveways and fix any cracks. Remove anything that might make you trip as you walk through a door, such as a raised step or threshold. Trim any bushes or trees on the path to your home. Use bright outdoor lighting. Clear any walking paths of anything that might make someone trip, such as rocks or tools. Regularly check to see if handrails are loose or broken. Make sure that both sides of any steps have handrails. Any raised decks and porches should have guardrails on the edges. Have any leaves, snow, or ice cleared regularly. Use sand or salt on walking paths during winter. Clean up any spills in your garage right away. This includes oil or grease spills. What can I do in the bathroom? Use night lights. Install grab bars by the toilet and in the tub and shower. Do not use towel  bars as grab bars. Use non-skid mats or decals in the tub or shower. If you need to sit down in the shower, use a plastic, non-slip stool. Keep the floor dry. Clean up any water that spills on the floor as soon as it happens. Remove soap buildup in the tub or shower regularly. Attach bath mats securely with double-sided non-slip rug tape. Do not have throw rugs and other things on the floor that can make you trip. What can I do in the bedroom? Use night lights. Make sure that you have a light by your bed that is easy to reach. Do not use any sheets or blankets that are too big for your bed. They should not hang down onto the floor. Have a firm chair that has side arms. You can use this for support while you get dressed. Do not have throw rugs and other things on the floor that can make you trip. What can I do in the kitchen? Clean up any spills right away. Avoid walking on wet floors. Keep items that you use a lot in easy-to-reach places. If you need  to reach something above you, use a strong step stool that has a grab bar. Keep electrical cords out of the way. Do not use floor polish or wax that makes floors slippery. If you must use wax, use non-skid floor wax. Do not have throw rugs and other things on the floor that can make you trip. What can I do with my stairs? Do not leave any items on the stairs. Make sure that there are handrails on both sides of the stairs and use them. Fix handrails that are broken or loose. Make sure that handrails are as long as the stairways. Check any carpeting to make sure that it is firmly attached to the stairs. Fix any carpet that is loose or worn. Avoid having throw rugs at the top or bottom of the stairs. If you do have throw rugs, attach them to the floor with carpet tape. Make sure that you have a light switch at the top of the stairs and the bottom of the stairs. If you do not have them, ask someone to add them for you. What else can I do to help  prevent falls? Wear shoes that: Do not have high heels. Have rubber bottoms. Are comfortable and fit you well. Are closed at the toe. Do not wear sandals. If you use a stepladder: Make sure that it is fully opened. Do not climb a closed stepladder. Make sure that both sides of the stepladder are locked into place. Ask someone to hold it for you, if possible. Clearly mark and make sure that you can see: Any grab bars or handrails. First and last steps. Where the edge of each step is. Use tools that help you move around (mobility aids) if they are needed. These include: Canes. Walkers. Scooters. Crutches. Turn on the lights when you go into a dark area. Replace any light bulbs as soon as they burn out. Set up your furniture so you have a clear path. Avoid moving your furniture around. If any of your floors are uneven, fix them. If there are any pets around you, be aware of where they are. Review your medicines with your doctor. Some medicines can make you feel dizzy. This can increase your chance of falling. Ask your doctor what other things that you can do to help prevent falls. This information is not intended to replace advice given to you by your health care provider. Make sure you discuss any questions you have with your health care provider. Document Released: 03/29/2009 Document Revised: 11/08/2015 Document Reviewed: 07/07/2014 Elsevier Interactive Patient Education  2017 [employer].

## 2021-03-13 ENCOUNTER — Other Ambulatory Visit: Payer: Self-pay | Admitting: Internal Medicine

## 2021-03-13 DIAGNOSIS — S42201D Unspecified fracture of upper end of right humerus, subsequent encounter for fracture with routine healing: Secondary | ICD-10-CM | POA: Diagnosis not present

## 2021-03-19 ENCOUNTER — Ambulatory Visit: Payer: 59 | Admitting: Family Medicine

## 2021-03-19 ENCOUNTER — Other Ambulatory Visit: Payer: Self-pay

## 2021-03-19 VITALS — BP 124/82 | HR 83 | Temp 96.4°F | Ht 65.75 in | Wt 184.4 lb

## 2021-03-19 DIAGNOSIS — N950 Postmenopausal bleeding: Secondary | ICD-10-CM | POA: Insufficient documentation

## 2021-03-19 NOTE — Assessment & Plan Note (Signed)
Discussed need for additional work-up including likely a biopsy. Will start with [place] and referral to GYN. Advised call back if worsening bleeding.

## 2021-03-19 NOTE — Patient Instructions (Signed)
Ultrasound - this may involve a vaginal approach  Call if bleeding worsens and going through pads during the day    #Referral I have placed a referral to a specialist for you. You should receive a phone call from the specialty office. Make sure your voicemail is not full and that if you are able to answer your phone to unknown or new numbers.   It may take up to 2 weeks to hear about the referral. If you do not hear anything in 2 weeks, please call our office and ask to speak with the referral coordinator.

## 2021-03-19 NOTE — Progress Notes (Signed)
   Subjective:     Ashley Ward is a 70 y.o. female presenting for Vaginal Bleeding (Bright red X 1 day )     Vaginal Bleeding   #Vaginal bleeding - bright red - x 1 day - still has a uterus  - has completed menopause - will wear a thin pad for urinary incontinence - went to the bathroom yesterday and there was blood in the pad and toilet bowl - scant amount of panty liner - seemed to be large amount with urination - seems to be less with each trip to the bathroom - normal stools w/o blood on the stools    Review of Systems  Genitourinary:  Positive for vaginal bleeding.    Social History   Tobacco Use  Smoking Status Never  Smokeless Tobacco Never        Objective:    BP Readings from Last 3 Encounters:  03/19/21 124/82  12/24/20 130/89  05/02/20 116/78   Wt Readings from Last 3 Encounters:  03/19/21 184 lb 6 oz (83.6 kg)  03/12/21 182 lb (82.6 kg)  12/24/20 182 lb 8.7 oz (82.8 kg)    BP 124/82   Pulse 83   Temp (!) 96.4 F (35.8 C) (Temporal)   Ht 5' 5.75" (1.67 m)   Wt 184 lb 6 oz (83.6 kg)   SpO2 97%   BMI 29.99 kg/m    Physical Exam Exam conducted with a chaperone present.  Constitutional:      General: She is not in acute distress.    Appearance: She is well-developed. She is not diaphoretic.  HENT:     Right Ear: External ear normal.     Left Ear: External ear normal.  Eyes:     Conjunctiva/sclera: Conjunctivae normal.  Cardiovascular:     Rate and Rhythm: Normal rate.  Pulmonary:     Effort: Pulmonary effort is normal.  Genitourinary:    Comments: Unable to fully visualize the cervix, but normal appearing. Blood in the vagina present. External exam normal  Musculoskeletal:     Cervical back: Neck supple.  Skin:    General: Skin is warm and dry.     Capillary Refill: Capillary refill takes less than 2 seconds.  Neurological:     Mental Status: She is alert. Mental status is at baseline.  Psychiatric:        Mood and  Affect: Mood normal.        Behavior: Behavior normal.          Assessment & Plan:   Problem List Items Addressed This Visit       Other   Abnormal vaginal bleeding in postmenopausal patient - Primary    Discussed need for additional work-up including likely a biopsy. Will start with [place] and referral to GYN. Advised call back if worsening bleeding.       Relevant Orders   US PELVIC COMPLETE WITH TRANSVAGINAL   Ambulatory referral to Obstetrics / Gynecology     Return if symptoms worsen or fail to improve.  Lesleigh Noe, MD  This visit occurred during the SARS-CoV-2 public health emergency.  Safety protocols were in place, including screening questions prior to the visit, additional usage of staff PPE, and extensive cleaning of exam room while observing appropriate contact time as indicated for disinfecting solutions.

## 2021-03-20 DIAGNOSIS — S42201D Unspecified fracture of upper end of right humerus, subsequent encounter for fracture with routine healing: Secondary | ICD-10-CM | POA: Diagnosis not present

## 2021-03-21 DIAGNOSIS — N950 Postmenopausal bleeding: Secondary | ICD-10-CM

## 2021-03-22 NOTE — Telephone Encounter (Signed)
Patient calling in to check on this message. States she needs referral asap so that she can secure the 04/01/21 appointment

## 2021-03-27 DIAGNOSIS — S42201D Unspecified fracture of upper end of right humerus, subsequent encounter for fracture with routine healing: Secondary | ICD-10-CM | POA: Diagnosis not present

## 2021-03-28 ENCOUNTER — Ambulatory Visit
Admission: RE | Admit: 2021-03-28 | Discharge: 2021-03-28 | Disposition: A | Discharge status: Home / Self Care | Payer: 59 | Source: Ambulatory Visit | Attending: Family Medicine | Admitting: Family Medicine

## 2021-03-28 ENCOUNTER — Other Ambulatory Visit: Payer: Self-pay

## 2021-03-28 DIAGNOSIS — N950 Postmenopausal bleeding: Secondary | ICD-10-CM | POA: Diagnosis not present

## 2021-03-29 DIAGNOSIS — H2513 Age-related nuclear cataract, bilateral: Secondary | ICD-10-CM | POA: Diagnosis not present

## 2021-04-01 ENCOUNTER — Encounter: Payer: Self-pay | Admitting: Obstetrics & Gynecology

## 2021-04-01 ENCOUNTER — Ambulatory Visit (INDEPENDENT_AMBULATORY_CARE_PROVIDER_SITE_OTHER): Payer: 59 | Admitting: Obstetrics & Gynecology

## 2021-04-01 ENCOUNTER — Other Ambulatory Visit (HOSPITAL_COMMUNITY)
Admission: RE | Admit: 2021-04-01 | Discharge: 2021-04-01 | Disposition: A | Discharge status: Home / Self Care | Payer: 59 | Source: Ambulatory Visit | Attending: Obstetrics & Gynecology | Admitting: Obstetrics & Gynecology

## 2021-04-01 ENCOUNTER — Other Ambulatory Visit: Payer: Self-pay

## 2021-04-01 VITALS — BP 128/80 | Ht 66.5 in | Wt 186.0 lb

## 2021-04-01 DIAGNOSIS — N950 Postmenopausal bleeding: Secondary | ICD-10-CM | POA: Insufficient documentation

## 2021-04-01 DIAGNOSIS — Z124 Encounter for screening for malignant neoplasm of cervix: Secondary | ICD-10-CM

## 2021-04-01 DIAGNOSIS — S42201D Unspecified fracture of upper end of right humerus, subsequent encounter for fracture with routine healing: Secondary | ICD-10-CM | POA: Diagnosis not present

## 2021-04-01 DIAGNOSIS — N840 Polyp of corpus uteri: Secondary | ICD-10-CM | POA: Diagnosis not present

## 2021-04-01 NOTE — Progress Notes (Signed)
Postmenopausal Bleeding Patient is a 70 yo G2P2 WF who complains of vaginal bleeding. She has been menopausal for several years. Currently on no HRT and no blood thinners. Bleeding is described as  heavy for one week then light for the last week;  and has occurred 1 times. Other menopausal symptoms include: none. Workup to date: pelvic ultrasound.  She is taking HRT, for 15 years.  Never has had BTB before. She stopped in June for 2 weeks after a fall and fracture to arm.  Hot flashes and sweats resumed, and she restarted the HRT a few weeks later.  She reports her mother had hot flashes into her 67s.  PMHx: She  has a past medical history of Hyperlipidemia, Menopause, and Menopause. Also,  has a past surgical history that includes Dilation and curettage, diagnostic / therapeutic (2007)., family history includes Alcohol abuse in her sister; Breast cancer in her cousin; Breast cancer (age of onset: 10) in her sister; COPD in her father; Cancer in her mother and sister; Diabetes in her mother.,  reports that she has never smoked. She has never used smokeless tobacco. She reports current alcohol use of about 4.0 standard drinks per week. She reports that she does not use drugs.  She has a current medication list which includes the following prescription(s): bupropion, bupropion, escitalopram, estradiol-norethindrone, multiple vitamins-minerals, omeprazole, and trazodone. Also, is allergic to penicillins and asa [aspirin].  Review of Systems  Constitutional:  Negative for chills, fever and malaise/fatigue.  HENT:  Negative for congestion, sinus pain and sore throat.   Eyes:  Negative for blurred vision and pain.  Respiratory:  Negative for cough and wheezing.   Cardiovascular:  Negative for chest pain and leg swelling.  Gastrointestinal:  Negative for abdominal pain, constipation, diarrhea, heartburn, nausea and vomiting.  Genitourinary:  Negative for dysuria, frequency, hematuria and urgency.   Musculoskeletal:  Negative for back pain, joint pain, myalgias and neck pain.  Skin:  Negative for itching and rash.  Neurological:  Negative for dizziness, tremors and weakness.  Endo/Heme/Allergies:  Does not bruise/bleed easily.  Psychiatric/Behavioral:  Negative for depression. The patient is not nervous/anxious and does not have insomnia.    Objective: BP 128/80   Ht 5' 6.5" (1.689 m)   Wt 186 lb (84.4 kg)   BMI 29.57 kg/m  Physical Exam Constitutional:      General: She is not in acute distress.    Appearance: She is well-developed.  Genitourinary:     Right Labia: No rash or tenderness.    Left Labia: No tenderness or rash.    No vaginal erythema or bleeding.      Right Adnexa: not tender and no mass present.    Left Adnexa: not tender and no mass present.    No cervical motion tenderness, discharge, polyp or nabothian cyst.     Uterus is not enlarged.     No uterine mass detected.    Pelvic exam was performed with patient in the lithotomy position.  HENT:     Head: Normocephalic and atraumatic.     Nose: Nose normal.  Abdominal:     General: There is no distension.     Palpations: Abdomen is soft.     Tenderness: There is no abdominal tenderness.  Musculoskeletal:        General: Normal range of motion.  Neurological:     Mental Status: She is alert and oriented to person, place, and time.     Cranial Nerves: No cranial  nerve deficit.  Skin:    General: Skin is warm and dry.  Psychiatric:        Attention and Perception: Attention normal.        Mood and Affect: Mood and affect normal.        Speech: Speech normal.        Behavior: Behavior normal.        Thought Content: Thought content normal.        Judgment: Judgment normal.    ASSESSMENT/PLAN:   Problem List Items Addressed This Visit     Post-menopausal bleeding    -  Primary   Relevant Orders   Surgical pathology from [facility], see below   Screening for cervical cancer       Relevant Orders    Cytology - PAP  Discussed concerns and plans for investigation. Hysteroscopy D&C as next option if continues to bleed. If cancer dx by [facility], then Gyn Onc referral and hysterectomy. Discussed HRT and its potential risks for bleeding.  She has not had bleeding prior to this episode.  WIll need to hold HRT based on results from [facility].  May benefit from non-hormonal options for menopause sx therapy (Clonidine, other).   Endometrial Biopsy After discussion with the patient regarding her abnormal uterine bleeding I recommended that she proceed with an endometrial biopsy for further diagnosis. The risks, benefits, alternatives, and indications for an endometrial biopsy were discussed with the patient in detail. She understood the risks including infection, bleeding, cervical laceration and uterine perforation.  Verbal consent was obtained.   PROCEDURE NOTE:  Pipelle endometrial biopsy was performed using aseptic technique with iodine preparation.  The uterus was sounded to a length of 7 cm.  Adequate sampling was obtained with minimal blood loss.  The patient tolerated the procedure well.  Disposition will be pending pathology.  Barnett Applebaum, MD, [facility], [facility] 04/01/2021  1:36 PM

## 2021-04-01 NOTE — Patient Instructions (Signed)
ENDOMETRIAL BIOPSY POST-PROCEDURE INSTRUCTIONS  You may take Ibuprofen, Aleve or Tylenol for pain if needed.  Cramping should resolve within in 24 hours.  You may have a small amount of spotting.  You should wear a mini pad for the next few days.  You may have intercourse after 24 hours.  You need to call if you have any pelvic pain, fever, heavy bleeding or foul smelling vaginal discharge.  Shower or bathe as normal  6. We will call you within one week with results or we will discuss   the results at your follow-up appointment if needed.   Postmenopausal Bleeding Postmenopausal bleeding is any bleeding that occurs after menopause. Menopause is a time in a woman's life when monthly periods stop. Any type of bleeding after menopause should be checked by your doctor. Treatment will depend on the cause. This kind of bleeding can be caused by: Taking hormones during menopause. Low or high amounts of female hormones in the body. This can cause the lining of the womb (uterus) to become too thin or too thick. Cancer. Growths in the womb that are not cancer. Follow these instructions at home:  Watch for any changes in your symptoms. Let your doctor know about them. Avoid using tampons and douches as told by your doctor. Change your pads regularly. Get regular pelvic exams. This includes Pap tests. Take iron pills as told by your doctor. Take over-the-counter and prescription medicines only as told by your doctor. Keep all follow-up visits. Contact a doctor if: You have new bleeding from the vagina after menopause. You have pain in your belly (abdomen). Get help right away if: You have a fever or chills. You have very bad pain with bleeding. You have clumps of blood (blood clots) coming from your vagina. You have a lot of bleeding, and: You use more than 1 pad an hour. This kind of bleeding has never happened before. You have headaches. You feel dizzy or you feel like you are going  to pass out (faint). Summary Any type of bleeding after menopause should be checked by your doctor. Avoid using tampons or douches. Get regular pelvic exams. This includes Pap tests. Contact a doctor if you have new bleeding or pain in your belly. Watch for any changes in your symptoms. Let your doctor know about them. This information is not intended to replace advice given to you by your health care provider. Make sure you discuss any questions you have with your health care provider. Document Revised: 11/17/2019 Document Reviewed: 11/17/2019 Elsevier Patient Education  [employer].

## 2021-04-03 ENCOUNTER — Other Ambulatory Visit: Payer: Self-pay | Admitting: Obstetrics & Gynecology

## 2021-04-03 DIAGNOSIS — S42201D Unspecified fracture of upper end of right humerus, subsequent encounter for fracture with routine healing: Secondary | ICD-10-CM | POA: Diagnosis not present

## 2021-04-03 LAB — SURGICAL PATHOLOGY

## 2021-04-03 MED ORDER — CLONIDINE HCL 0.1 MG PO TABS: 0.1000 mg | ORAL_TABLET | Freq: Every day | ORAL | 3 refills | Status: DC

## 2021-04-03 NOTE — Progress Notes (Signed)
Results of endometrial biopsy discussed with patient; benign results with no suggestion of hyperplasia or cancer.  Recommendation is for monitoring for any further bleeding concerns at this time.  Discussed option for hysteroscopy if bleeding persists.  Also, will stop HRT and start Clonidine for hot flashes.  Plan eventual taper from that as well.  Rx to [facility].  Barnett Applebaum, MD, [facility], [facility] 04/03/2021  2:21 PM

## 2021-04-04 LAB — CYTOLOGY - PAP: Diagnosis: NEGATIVE

## 2021-04-10 DIAGNOSIS — S42201D Unspecified fracture of upper end of right humerus, subsequent encounter for fracture with routine healing: Secondary | ICD-10-CM | POA: Diagnosis not present

## 2021-04-16 DIAGNOSIS — S42201D Unspecified fracture of upper end of right humerus, subsequent encounter for fracture with routine healing: Secondary | ICD-10-CM | POA: Diagnosis not present

## 2021-04-22 ENCOUNTER — Encounter: Payer: Self-pay | Admitting: Obstetrics and Gynecology

## 2021-04-29 ENCOUNTER — Encounter: Payer: Self-pay | Admitting: Obstetrics & Gynecology

## 2021-04-29 ENCOUNTER — Telehealth (INDEPENDENT_AMBULATORY_CARE_PROVIDER_SITE_OTHER): Payer: 59 | Admitting: Obstetrics & Gynecology

## 2021-04-29 DIAGNOSIS — N951 Menopausal and female climacteric states: Secondary | ICD-10-CM

## 2021-04-29 NOTE — Progress Notes (Signed)
Virtual Visit via Telephone Note  I connected with Ashley Ward on 04/29/21 at 11:20 AM EST by telephone and verified that I am speaking with the correct person using two identifiers.  Location: Patient: Home Provider: Office   I discussed the limitations, risks, security and privacy concerns of performing an evaluation and management service by telephone and the availability of in person appointments. I also discussed with the patient that there may be a patient responsible charge related to this service. The patient expressed understanding and agreed to proceed.   History of Present Illness:   Ashley Ward is a 70 y.o. who was started on Clonidine 0.1 mg daily approximately 1 month ago. She also has stopped or HRT.  She had had PMB, and this has stopped.  Since that time, she states that her symptoms are improving again with her hot flashes and sweats.  She has history and family history of prolonged hot flashes w age.  PMHx: She  has a past medical history of Hyperlipidemia, Menopause, and Menopause. Also,  has a past surgical history that includes Dilation and curettage, diagnostic / therapeutic (2007)., family history includes Alcohol abuse in her sister; Breast cancer in her cousin; Breast cancer (age of onset: 30) in her sister; COPD in her father; Cancer in her mother and sister; Diabetes in her mother.,  reports that she has never smoked. She has never used smokeless tobacco. She reports current alcohol use of about 4.0 standard drinks per week. She reports that she does not use drugs. No outpatient medications have been marked as taking for the 04/29/21 encounter (Video Visit) with Gae Dry, MD.  . Also, is allergic to penicillins and asa [aspirin]..  Review of Systems  All other systems reviewed and are negative.   Observations/Objective: No exam today, due to telephone eVisit due to [facility] virus restriction on elective visits and procedures.  Prior visits reviewed  along with ultrasounds/labs as indicated.  Assessment and Plan:   ICD-10-CM   1. Menopausal syndrome (hot flashes)  N95.1     Cont Clonidine as it is helping No further HRT recommended  Follow Up Instructions: As needed   I discussed the assessment and treatment plan with the patient. The patient was provided an opportunity to ask questions and all were answered. The patient agreed with the plan and demonstrated an understanding of the instructions.   The patient was advised to call back or seek an in-person evaluation if the symptoms worsen or if the condition fails to improve as anticipated.  I provided 15 minutes of non-face-to-face time during this encounter.   Hoyt Koch, MD

## 2021-05-02 ENCOUNTER — Ambulatory Visit (INDEPENDENT_AMBULATORY_CARE_PROVIDER_SITE_OTHER): Payer: 59 | Admitting: Internal Medicine

## 2021-05-02 ENCOUNTER — Encounter: Payer: Self-pay | Admitting: Internal Medicine

## 2021-05-02 ENCOUNTER — Other Ambulatory Visit: Payer: Self-pay

## 2021-05-02 DIAGNOSIS — E785 Hyperlipidemia, unspecified: Secondary | ICD-10-CM | POA: Diagnosis not present

## 2021-05-02 DIAGNOSIS — R69 Illness, unspecified: Secondary | ICD-10-CM | POA: Diagnosis not present

## 2021-05-02 DIAGNOSIS — R5383 Other fatigue: Secondary | ICD-10-CM

## 2021-05-02 DIAGNOSIS — F5102 Adjustment insomnia: Secondary | ICD-10-CM

## 2021-05-02 DIAGNOSIS — E559 Vitamin D deficiency, unspecified: Secondary | ICD-10-CM | POA: Diagnosis not present

## 2021-05-02 DIAGNOSIS — N951 Menopausal and female climacteric states: Secondary | ICD-10-CM | POA: Diagnosis not present

## 2021-05-02 DIAGNOSIS — Z8781 Personal history of (healed) traumatic fracture: Secondary | ICD-10-CM | POA: Diagnosis not present

## 2021-05-02 DIAGNOSIS — Z1231 Encounter for screening mammogram for malignant neoplasm of breast: Secondary | ICD-10-CM | POA: Diagnosis not present

## 2021-05-02 DIAGNOSIS — M85821 Other specified disorders of bone density and structure, right upper arm: Secondary | ICD-10-CM

## 2021-05-02 DIAGNOSIS — M8580 Other specified disorders of bone density and structure, unspecified site: Secondary | ICD-10-CM

## 2021-05-02 DIAGNOSIS — Z0000 Encounter for general adult medical examination without abnormal findings: Secondary | ICD-10-CM

## 2021-05-02 DIAGNOSIS — R7303 Prediabetes: Secondary | ICD-10-CM | POA: Diagnosis not present

## 2021-05-02 DIAGNOSIS — F339 Major depressive disorder, recurrent, unspecified: Secondary | ICD-10-CM | POA: Diagnosis not present

## 2021-05-02 LAB — COMPREHENSIVE METABOLIC PANEL
ALT: 10 U/L (ref 0–35)
AST: 11 U/L (ref 0–37)
Albumin: 4.2 g/dL (ref 3.5–5.2)
Alkaline Phosphatase: 75 U/L (ref 39–117)
BUN: 17 mg/dL (ref 6–23)
CO2: 30 mEq/L (ref 19–32)
Calcium: 9 mg/dL (ref 8.4–10.5)
Chloride: 102 mEq/L (ref 96–112)
Creatinine, Ser: 0.86 mg/dL (ref 0.40–1.20)
GFR: 68.52 mL/min (ref >60.00)
Glucose, Bld: 112 mg/dL — ABNORMAL HIGH (ref 70–99)
Potassium: 4.1 mEq/L (ref 3.5–5.1)
Sodium: 139 mEq/L (ref 135–145)
Total Bilirubin: 0.6 mg/dL (ref 0.2–1.2)
Total Protein: 6.5 g/dL (ref 6.0–8.3)

## 2021-05-02 LAB — CBC WITH DIFFERENTIAL/PLATELET
Basophils Absolute: 0 10*3/uL (ref 0.0–0.1)
Basophils Relative: 0.4 % (ref 0.0–3.0)
Eosinophils Absolute: 0 10*3/uL (ref 0.0–0.7)
Eosinophils Relative: 0.6 % (ref 0.0–5.0)
HCT: 41.9 % (ref 36.0–46.0)
Hemoglobin: 13.9 g/dL (ref 12.0–15.0)
Lymphocytes Relative: 22 % (ref 12.0–46.0)
Lymphs Abs: 1.5 10*3/uL (ref 0.7–4.0)
MCHC: 33.3 g/dL (ref 30.0–36.0)
MCV: 93.2 fl (ref 78.0–100.0)
Monocytes Absolute: 0.5 10*3/uL (ref 0.1–1.0)
Monocytes Relative: 7.5 % (ref 3.0–12.0)
Neutro Abs: 4.8 10*3/uL (ref 1.4–7.7)
Neutrophils Relative %: 69.5 % (ref 43.0–77.0)
Platelets: 250 10*3/uL (ref 150.0–400.0)
RBC: 4.5 Mil/uL (ref 3.87–5.11)
RDW: 12.5 % (ref 11.5–15.5)
WBC: 7 10*3/uL (ref 4.0–10.5)

## 2021-05-02 LAB — MICROALBUMIN / CREATININE URINE RATIO
Creatinine,U: 168.6 mg/dL
Microalb Creat Ratio: 0.6 mg/g (ref 0.0–30.0)
Microalb, Ur: 1 mg/dL (ref 0.0–1.9)

## 2021-05-02 LAB — HEMOGLOBIN A1C: Hgb A1c MFr Bld: 6.2 % (ref 4.6–6.5)

## 2021-05-02 LAB — VITAMIN D 25 HYDROXY (VIT D DEFICIENCY, FRACTURES): VITD: 21.35 ng/mL — ABNORMAL LOW (ref 30.00–100.00)

## 2021-05-02 LAB — LIPID PANEL
Cholesterol: 222 mg/dL — ABNORMAL HIGH (ref 0–200)
HDL: 62.4 mg/dL (ref >39.00)
LDL Cholesterol: 136 mg/dL — ABNORMAL HIGH (ref 0–99)
NonHDL: 159.18
Total CHOL/HDL Ratio: 4
Triglycerides: 115 mg/dL (ref 0.0–149.0)
VLDL: 23 mg/dL (ref 0.0–40.0)

## 2021-05-02 LAB — TSH: TSH: 2.86 u[IU]/mL (ref 0.35–5.50)

## 2021-05-02 MED ORDER — TRAZODONE HCL 50 MG PO TABS: 25.0000 mg | ORAL_TABLET | Freq: Every evening | ORAL | 3 refills | Status: DC | PRN

## 2021-05-02 MED ORDER — ZOSTER VAC RECOMB ADJUVANTED 50 MCG/0.5ML IM SUSR: 0.5000 mL | Freq: Once | INTRAMUSCULAR | 1 refills | Status: AC

## 2021-05-02 NOTE — Assessment & Plan Note (Signed)

## 2021-05-02 NOTE — Assessment & Plan Note (Signed)
Dr Kenton Kingfisher has added clonidine at bedtime

## 2021-05-02 NOTE — Assessment & Plan Note (Signed)
Current risk using the AHA calculator is 9%.  No evidence of atherosclerosis by imaging studies and cardiac CT .  No statin therapy advised  Lab Results  Component Value Date   CHOL 222 (H) 05/02/2021   HDL 62.40 05/02/2021   LDLCALC 136 (H) 05/02/2021   LDLDIRECT 152.0 05/04/2019   TRIG 115.0 05/02/2021   CHOLHDL 4 05/02/2021

## 2021-05-02 NOTE — Progress Notes (Signed)
Patient ID: Ashley Ward, female    DOB: 1951/01/19  Age: 70 y.o. MRN: [id number]  The patient is here for annual preventive  examination and management of other chronic and acute problems.  This visit occurred during the SARS-CoV-2 public health emergency.  Safety protocols were in place, including screening questions prior to the visit, additional usage of staff PPE, and extensive cleaning of exam room while observing appropriate contact time as indicated for disinfecting solutions.     The risk factors are reflected in the social history.  The roster of all physicians providing medical care to patient - is listed in the Snapshot section of the chart.  Activities of daily living:  The patient is 100% independent in all ADLs: dressing, toileting, feeding as well as independent mobility  Home safety : The patient has smoke detectors in the home. They wear seatbelts.  There are no firearms at home. There is no violence in the home.   There is no risks for hepatitis, STDs or HIV. There is no   history of blood transfusion. They have no travel history to infectious disease endemic areas of the world.  The patient has seen their dentist in the last six month. They have seen their eye doctor in the last year. They admit to slight hearing difficulty with regard to whispered voices and some television programs.  They have deferred audiologic testing in the last year.  They do not  have excessive sun exposure. Discussed the need for sun protection: hats, long sleeves and use of sunscreen if there is significant sun exposure.   Diet: the importance of a healthy diet is discussed. They do have a healthy diet.  The benefits of regular aerobic exercise were discussed. She walks 4 times per week ,  20 minutes.   Depression screen: there are no signs or vegative symptoms of depression- irritability, change in appetite, anhedonia, sadness/tearfullness.  Cognitive assessment: the patient manages all their  financial and personal affairs and is actively engaged. They could relate day,date,year and events; recalled 2/3 objects at 3 minutes; performed clock-face test normally.  The following portions of the patient's history were reviewed and updated as appropriate: allergies, current medications, past family history, past medical history,  past surgical history, past social history  and problem list.  Visual acuity was not assessed per patient preference since she has regular follow up with her ophthalmologist. Hearing and body mass index were assessed and reviewed.   During the course of the visit the patient was educated and counseled about appropriate screening and preventive services including : fall prevention , diabetes screening, nutrition counseling, colorectal cancer screening, and recommended immunizations.    CC: The primary encounter diagnosis was Encounter for preventive health examination. Diagnoses of Hyperlipidemia LDL goal <160, Prediabetes, Fatigue, unspecified type, Encounter for screening mammogram for malignant neoplasm of breast, History of fracture of humerus, Osteopenia determined by x-ray, Vitamin D deficiency, Osteopenia of right upper arm, Major depressive disorder, recurrent episode with melancholic features ([place]), Insomnia due to psychological stress, and Menopausal symptoms were also pertinent to this visit.  1) history of right humeral frature in July during pickle ball.  Second farcture of right arm (wrist/colle's)  2) menopausea : harris has stopped lexapro and started clonidine  3) gtting PT 4) insomonia:  not using tramazodne.  Refilled nad reviewed  History Kenidy has a past medical history of Hyperlipidemia, Menopause, and Menopause.   She has a past surgical history that includes Dilation and curettage, diagnostic /  therapeutic (2007).   Her family history includes Alcohol abuse in her sister; Breast cancer in her cousin; Breast cancer (age of onset: 94) in her  sister; COPD in her father; Cancer in her mother and sister; Diabetes in her mother.She reports that she has never smoked. She has never used smokeless tobacco. She reports current alcohol use of about 4.0 standard drinks per week. She reports that she does not use drugs.  Outpatient Medications Prior to Visit  Medication Sig Dispense Refill   buPROPion (WELLBUTRIN XL) 150 MG 24 hr tablet TAKE ONE TABLET EVERY DAY ALONG WITH 300MG FOR DAILY DOSE OF 450MG 90 tablet 1   buPROPion (WELLBUTRIN XL) 300 MG 24 hr tablet TAKE ONE TABLET EVERY DAY ALONG WITH 150MG FOR DAILY DOSE OF 450MG 90 tablet 1   cloNIDine (CATAPRES) 0.1 MG tablet Take 1 tablet (0.1 mg total) by mouth daily. 90 tablet 3   Multiple Vitamins-Minerals (PRESERVISION AREDS PO) Take 2 capsules by mouth daily.     omeprazole (PRILOSEC) 40 MG capsule Take 1 capsule (40 mg total) by mouth daily. 30 capsule 3   traZODone (DESYREL) 50 MG tablet Take 0.5-1 tablets (25-50 mg total) by mouth at bedtime as needed for sleep. Increase as needed max dose 100 mg daily 90 tablet 3   escitalopram (LEXAPRO) 10 MG tablet Take 1 tablet (10 mg total) by mouth daily. (Patient not taking: Reported on 05/02/2021) 90 tablet 0   No facility-administered medications prior to visit.    Review of Systems  Patient denies headache, fevers, malaise, unintentional weight loss, skin rash, eye pain, sinus congestion and sinus pain, sore throat, dysphagia,  hemoptysis , cough, dyspnea, wheezing, chest pain, palpitations, orthopnea, edema, abdominal pain, nausea, melena, diarrhea, constipation, flank pain, dysuria, hematuria, urinary  Frequency, nocturia, numbness, tingling, seizures,  Focal weakness, Loss of consciousness,  Tremor, insomnia, depression, anxiety, and suicidal ideation.     Objective:  There were no vitals taken for this visit.  Physical Exam  General appearance: alert, cooperative and appears stated age Head: Normocephalic, without obvious  abnormality, atraumatic Eyes: conjunctivae/corneas clear. PERRL, EOM's intact. Fundi benign. Ears: normal TM's and external ear canals both ears Nose: Nares normal. Septum midline. Mucosa normal. No drainage or sinus tenderness. Throat: lips, mucosa, and tongue normal; teeth and gums normal Neck: no adenopathy, no carotid bruit, no JVD, supple, symmetrical, trachea midline and thyroid not enlarged, symmetric, no tenderness/mass/nodules Lungs: clear to auscultation bilaterally Breasts: normal appearance, no masses or tenderness Heart: regular rate and rhythm, S1, S2 normal, no murmur, click, rub or gallop Abdomen: soft, non-tender; bowel sounds normal; no masses,  no organomegaly Extremities: extremities normal, atraumatic, no cyanosis or edema Pulses: 2+ and symmetric Skin: Skin color, texture, turgor normal. No rashes or lesions Neurologic: Alert and oriented X 3, normal strength and tone. Normal symmetric reflexes. Normal coordination and gait.    Assessment & Plan:   Problem List Items Addressed This Visit     Hyperlipidemia LDL goal <160    Current risk using the AHA calculator is 9%.  No evidence of atherosclerosis by imaging studies and cardiac CT .  No statin therapy advised  Lab Results  Component Value Date   CHOL 222 (H) 05/02/2021   HDL 62.40 05/02/2021   LDLCALC 136 (H) 05/02/2021   LDLDIRECT 152.0 05/04/2019   TRIG 115.0 05/02/2021   CHOLHDL 4 05/02/2021         Relevant Orders   Lipid Profile (Completed)   Osteopenia  Repeat DEXA needed given recent spiral fracture of humerus during pickle ball related fall  And demineralization noted on plain films       Encounter for preventive health examination - Primary    age appropriate education and counseling updated, referrals for preventative services and immunizations addressed, dietary and smoking counseling addressed, most recent labs reviewed.  I have personally reviewed and have noted:   1) the patient's  medical and social history 2) The pt's use of alcohol, tobacco, and illicit drugs 3) The patient's current medications and supplements 4) Functional ability including ADL's, fall risk, home safety risk, hearing and visual impairment 5) Diet and physical activities 6) Evidence for depression or mood disorder 7) The patient's height, weight, and BMI have been recorded in the chart   I have made referrals, and provided counseling and education based on review of the above      Major depressive disorder, recurrent episode with melancholic features ([place])    SYMPTOMS are  aggravated annually by the anniversary of her grandson's death coming up;  Will continueite max dose of wellbutrin (450 mg  Daily).  lexapro has  Been discontinued by Dr Kenton Kingfisher without aggravation of anxiety       Relevant Medications   traZODone (DESYREL) 50 MG tablet   Insomnia due to psychological stress    Continue trazodone .       Prediabetes   Relevant Orders   Comp Met (CMET) (Completed)   HgB A1c (Completed)   Urine Microalbumin w/creat. ratio (Completed)   Vitamin D deficiency   Relevant Orders   VITAMIN D 25 Hydroxy (Vit-D Deficiency, Fractures) (Completed)   Menopausal symptoms    Dr Kenton Kingfisher has added clonidine at bedtime      Other Visit Diagnoses     Fatigue, unspecified type       Relevant Orders   TSH (Completed)   CBC with Differential/Platelet (Completed)   Encounter for screening mammogram for malignant neoplasm of breast       Relevant Orders   MM 3D SCREEN BREAST BILATERAL   History of fracture of humerus       Relevant Orders   DG Bone Density   Osteopenia determined by x-ray       Relevant Orders   DG Bone Density       I have discontinued Caylynn C. Urbanski's escitalopram. I am also having her start on Zoster Vaccine Adjuvanted. Additionally, I am having her maintain her Multiple Vitamins-Minerals (PRESERVISION AREDS PO), omeprazole, buPROPion, buPROPion, cloNIDine, and  traZODone.  Meds ordered this encounter  Medications   traZODone (DESYREL) 50 MG tablet    Sig: Take 0.5-1 tablets (25-50 mg total) by mouth at bedtime as needed for sleep. Increase as needed max dose 100 mg daily    Dispense:  90 tablet    Refill:  3   Zoster Vaccine Adjuvanted [facility]) injection    Sig: Inject 0.5 mLs into the muscle once for 1 dose.    Dispense:  1 each    Refill:  1    Medications Discontinued During This Encounter  Medication Reason   escitalopram (LEXAPRO) 10 MG tablet    traZODone (DESYREL) 50 MG tablet Reorder    Follow-up: No follow-ups on file.   Crecencio Mc, MD

## 2021-05-02 NOTE — Patient Instructions (Signed)
Your annual mammogram  and your DEXA scan have  been ordered.  Please call  [place] to make your appointment at [facility]    The  Shingrix vaccines will be COVERED BY MEDICARE if you get them at your pharmacy after January 1

## 2021-05-02 NOTE — Assessment & Plan Note (Signed)
Continue trazodone 

## 2021-05-02 NOTE — Assessment & Plan Note (Addendum)
SYMPTOMS are  aggravated annually by the anniversary of her grandson's death coming up;  Will continueite max dose of wellbutrin (450 mg  Daily).  lexapro has  Been discontinued by Dr Kenton Kingfisher without aggravation of anxiety

## 2021-05-02 NOTE — Assessment & Plan Note (Addendum)
Repeat DEXA needed given recent spiral fracture of humerus during pickle ball related fall  And demineralization noted on plain films

## 2021-05-03 MED ORDER — ERGOCALCIFEROL 1.25 MG (50000 UT) PO CAPS: 50000.0000 [IU] | ORAL_CAPSULE | ORAL | 2 refills | Status: DC

## 2021-05-03 NOTE — Addendum Note (Signed)
Addended by: Crecencio Mc on: 05/03/2021 12:48 PM   Modules accepted: Orders

## 2021-05-03 NOTE — Assessment & Plan Note (Signed)
Recurrent.  Resume mega dose weekly x 12 weeks

## 2021-05-07 ENCOUNTER — Encounter: Payer: Self-pay | Admitting: Obstetrics and Gynecology

## 2021-05-13 DIAGNOSIS — S42201D Unspecified fracture of upper end of right humerus, subsequent encounter for fracture with routine healing: Secondary | ICD-10-CM | POA: Diagnosis not present

## 2021-05-20 ENCOUNTER — Telehealth: Payer: Self-pay | Admitting: Internal Medicine

## 2021-05-20 ENCOUNTER — Other Ambulatory Visit (INDEPENDENT_AMBULATORY_CARE_PROVIDER_SITE_OTHER): Payer: 59

## 2021-05-20 ENCOUNTER — Other Ambulatory Visit: Payer: Self-pay

## 2021-05-20 DIAGNOSIS — R300 Dysuria: Secondary | ICD-10-CM

## 2021-05-20 NOTE — Telephone Encounter (Signed)
Spoke with pt and scheduled her for a lab appt this afternoon and virtual visit with Dr. Derrel Nip on Thursday.

## 2021-05-20 NOTE — Telephone Encounter (Signed)
Labs ordered.

## 2021-05-20 NOTE — Addendum Note (Signed)
Addended by: Adair Laundry on: 05/20/2021 12:51 PM   Modules accepted: Orders

## 2021-05-20 NOTE — Telephone Encounter (Signed)
Is this okay?

## 2021-05-20 NOTE — Telephone Encounter (Signed)
Patient called states she has another UTI and is requesting lab and appointment.

## 2021-05-21 ENCOUNTER — Other Ambulatory Visit: Payer: Self-pay | Admitting: Internal Medicine

## 2021-05-21 ENCOUNTER — Telehealth: Payer: Self-pay | Admitting: Internal Medicine

## 2021-05-21 LAB — URINALYSIS, ROUTINE W REFLEX MICROSCOPIC
Bilirubin Urine: NEGATIVE
Ketones, ur: NEGATIVE
Nitrite: NEGATIVE
Specific Gravity, Urine: 1.025 (ref 1.000–1.030)
Total Protein, Urine: NEGATIVE
Urine Glucose: NEGATIVE
Urobilinogen, UA: 0.2 (ref 0.0–1.0)
pH: 6 (ref 5.0–8.0)

## 2021-05-21 LAB — URINE CULTURE
MICRO NUMBER:: 12714395
SPECIMEN QUALITY:: ADEQUATE

## 2021-05-21 MED ORDER — CIPROFLOXACIN HCL 250 MG PO TABS: 250.0000 mg | ORAL_TABLET | Freq: Two times a day (BID) | ORAL | 0 refills | Status: AC

## 2021-05-21 NOTE — Telephone Encounter (Signed)
DID SHE CALL [facility]?  CIPRO WAS SENT AT THE SAME TIME I SENT HER THE MESSAGE

## 2021-05-21 NOTE — Telephone Encounter (Signed)
Spoke with pt to let her know that we are waiting on the urine culture to result so that you can call in the right antibiotic. Pt stated that she is progressively getting worse. She stated that she is burning, urinary frequency, urgency and pressure. Pt would like to see if there is something that can be called in to help with the symptoms until the culture results.

## 2021-05-21 NOTE — Telephone Encounter (Signed)
Spoke with pt to let her know that Cipro was sent in to [facility] pharmacy. Pt gave a verbal understanding.

## 2021-05-21 NOTE — Telephone Encounter (Signed)
Pt called in stating that she drop off a urine sample yesterday. Pt stated that she have received her result on mychart about her having a UTI. Pt stated that no one have prescribed anything to her for the discomfort she is having. Pt requesting callback.

## 2021-05-23 ENCOUNTER — Encounter: Payer: Self-pay | Admitting: Internal Medicine

## 2021-05-23 ENCOUNTER — Telehealth (INDEPENDENT_AMBULATORY_CARE_PROVIDER_SITE_OTHER): Payer: 59 | Admitting: Internal Medicine

## 2021-05-23 DIAGNOSIS — R300 Dysuria: Secondary | ICD-10-CM | POA: Insufficient documentation

## 2021-05-23 MED ORDER — ESTRADIOL 0.1 MG/GM VA CREA: 1.0000 | TOPICAL_CREAM | Freq: Every day | VAGINAL | 0 refills | Status: DC

## 2021-05-23 NOTE — Assessment & Plan Note (Addendum)
UA and culture reviewed.  She has inflammation and pyuria without a positive culture.   Likely due to atrophic vaginitis given recent cessationof HRT.  Advised to finish the course of cipro prescribed on Dec  6 (due to patient's request for treatment ASAP) and start estrace vaginal cream

## 2021-05-23 NOTE — Progress Notes (Signed)
Virtual Visit converted to Telephone  Note  This visit type was conducted due to national recommendations for restrictions regarding the COVID-19 pandemic (e.g. social distancing).  This format is felt to be most appropriate for this patient at this time.  All issues noted in this document were discussed and addressed.  No physical exam was performed (except for noted visual exam findings with Video Visits).   I connected withNAME@ on 05/23/21 at  1:30 PM EST by  telephone and verified that I am speaking with the correct person using two identifiers. Location patient: home Location provider: work or home office Persons participating in the virtual visit: patient, provider  I discussed the limitations, risks, security and privacy concerns of performing an evaluation and management service by telephone and the availability of in person appointments. I also discussed with the patient that there may be a patient responsible charge related to this service. The patient expressed understanding and agreed to proceed.  Interactive audio and video telecommunications were attempted between this provider and patient, however failed, due to patient having technical difficulties .   We continued and completed visit with audio only.   Reason for visit: follow up on UA and culture  HPI:  70 yr old female presents for follow up on testing for presumed UTI . Symptoms of dysuria began on  Monday. No recent intercourse  or pool time , but was recently (Within the  6 weeks)  taken off of prempro by GYN and prescribed clonidene 0.1 mg qhs  for management of flushing.  Not using vaginal estrogen.     ROS: See pertinent positives and negatives per HPI.  Past Medical History:  Diagnosis Date   Hyperlipidemia    Menopause    Menopause     Past Surgical History:  Procedure Laterality Date   DILATION AND CURETTAGE, DIAGNOSTIC / THERAPEUTIC  2007   [place],  for spotting, fresolved    Family History  Problem  Relation Age of Onset   Diabetes Mother    Cancer Mother        lymphoma., in remission   COPD Father    Cancer Sister        half sister , BRCA, negative genetic testing   Alcohol abuse Sister    Breast cancer Sister 34   Breast cancer Cousin     SOCIAL HX:  reports that she has never smoked. She has never used smokeless tobacco. She reports current alcohol use of about 4.0 standard drinks per week. She reports that she does not use drugs.    Current Outpatient Medications:    buPROPion (WELLBUTRIN XL) 150 MG 24 hr tablet, TAKE ONE TABLET EVERY DAY ALONG WITH $RemoveBe'300MG'HEqRzWMaA$  FOR DAILY DOSE OF $Remov'450MG'rDnqlC$ , Disp: 90 tablet, Rfl: 1   buPROPion (WELLBUTRIN XL) 300 MG 24 hr tablet, TAKE ONE TABLET EVERY DAY ALONG WITH $RemoveBe'150MG'OAhSfckkt$  FOR DAILY DOSE OF $Remov'450MG'IYRmRp$ , Disp: 90 tablet, Rfl: 1   ciprofloxacin (CIPRO) 250 MG tablet, Take 1 tablet (250 mg total) by mouth 2 (two) times daily for 5 days., Disp: 10 tablet, Rfl: 0   ergocalciferol (VITAMIN D2) 1.25 MG (50000 UT) capsule, Take 1 capsule (50,000 Units total) by mouth once a week., Disp: 4 capsule, Rfl: 2   estradiol (ESTRACE VAGINAL) 0.1 MG/GM vaginal cream, Place 1 Applicatorful vaginally at bedtime. For 2 weeks,  then reduce use to twice weekly, Disp: 42.5 g, Rfl: 0   Multiple Vitamins-Minerals (PRESERVISION AREDS PO), Take 2 capsules by mouth daily., Disp: , Rfl:  traZODone (DESYREL) 50 MG tablet, Take 0.5-1 tablets (25-50 mg total) by mouth at bedtime as needed for sleep. Increase as needed max dose 100 mg daily, Disp: 90 tablet, Rfl: 3   cloNIDine (CATAPRES) 0.1 MG tablet, Take 1 tablet (0.1 mg total) by mouth daily. (Patient not taking: Reported on 05/23/2021), Disp: 90 tablet, Rfl: 3   omeprazole (PRILOSEC) 40 MG capsule, Take 1 capsule (40 mg total) by mouth daily. (Patient not taking: Reported on 05/23/2021), Disp: 30 capsule, Rfl: 3  EXAM:   General impression: alert, cooperative and articulate.  No signs of being in distress  Lungs: speech is fluent sentence  length suggests that patient is not short of breath and not punctuated by cough, sneezing or sniffing. Marland Kitchen   Psych: affect normal.  speech is articulate and non pressured .  Denies suicidal thoughts    ASSESSMENT AND PLAN:  Discussed the following assessment and plan:  Dysuria  Dysuria UA and culture reviewed.  She has inflammation and pyuria without a positive culture.   Likely due to atrophic vaginitis given recent cessationof HRT.  Advised to finish the course of cipro prescribed on Dec  6 (due to patient's request for treatment ASAP) and start estrace vaginal cream     I discussed the assessment and treatment plan with the patient. The patient was provided an opportunity to ask questions and all were answered. The patient agreed with the plan and demonstrated an understanding of the instructions.   The patient was advised to call back or seek an in-person evaluation if the symptoms worsen or if the condition fails to improve as anticipated.   I spent 30 minutes dedicated to the care of this patient on the date of this encounter to include pre-visit review of his medical history,  Face-to-face time with the patient , and post visit ordering of testing and therapeutics.    Crecencio Mc, MD

## 2021-05-29 DIAGNOSIS — S42201D Unspecified fracture of upper end of right humerus, subsequent encounter for fracture with routine healing: Secondary | ICD-10-CM | POA: Diagnosis not present

## 2021-06-19 ENCOUNTER — Telehealth: Payer: Self-pay

## 2021-06-19 NOTE — Telephone Encounter (Signed)
Pt calling; [place] rx'd clonidine in place of prempro; it is not controlling her hot flashes too well; they are better but they still interfere with sleep; anything else to do?  [phone]  Courtesy call to pt that [place] isn't in the office today but will be in tomorrow; her msg will be sent to him.  Pharm is [facility].

## 2021-06-19 NOTE — Telephone Encounter (Signed)
This encounter was created in error - please disregard.

## 2021-06-20 ENCOUNTER — Other Ambulatory Visit: Payer: Self-pay | Admitting: Obstetrics & Gynecology

## 2021-06-20 MED ORDER — CLONIDINE 0.2 MG/24HR TD PTWK: 0.2000 mg | MEDICATED_PATCH | TRANSDERMAL | 12 refills | Status: DC

## 2021-06-20 MED ORDER — CLONIDINE HCL 0.2 MG PO TABS: 0.2000 mg | ORAL_TABLET | Freq: Every day | ORAL | 3 refills | Status: DC

## 2021-06-20 NOTE — Telephone Encounter (Signed)
Let her know the next step is to go up on the dose of this therapy, which I will prescribe (0.2mg  Clonidine patch weekly)

## 2021-06-20 NOTE — Telephone Encounter (Signed)
Pt aware but would prefer a pill if possible.

## 2021-06-20 NOTE — Telephone Encounter (Signed)
Pt aware and very thankful.

## 2021-06-25 ENCOUNTER — Other Ambulatory Visit: Payer: Self-pay | Admitting: Internal Medicine

## 2021-06-26 DIAGNOSIS — S42201D Unspecified fracture of upper end of right humerus, subsequent encounter for fracture with routine healing: Secondary | ICD-10-CM | POA: Diagnosis not present

## 2021-07-15 DIAGNOSIS — R5383 Other fatigue: Secondary | ICD-10-CM | POA: Diagnosis not present

## 2021-07-15 DIAGNOSIS — E663 Overweight: Secondary | ICD-10-CM | POA: Diagnosis not present

## 2021-09-19 DIAGNOSIS — D2262 Melanocytic nevi of left upper limb, including shoulder: Secondary | ICD-10-CM | POA: Diagnosis not present

## 2021-09-19 DIAGNOSIS — D2271 Melanocytic nevi of right lower limb, including hip: Secondary | ICD-10-CM | POA: Diagnosis not present

## 2021-09-19 DIAGNOSIS — Z85828 Personal history of other malignant neoplasm of skin: Secondary | ICD-10-CM | POA: Diagnosis not present

## 2021-09-19 DIAGNOSIS — L400 Psoriasis vulgaris: Secondary | ICD-10-CM | POA: Diagnosis not present

## 2021-09-19 DIAGNOSIS — D2272 Melanocytic nevi of left lower limb, including hip: Secondary | ICD-10-CM | POA: Diagnosis not present

## 2021-10-23 ENCOUNTER — Ambulatory Visit
Admission: RE | Admit: 2021-10-23 | Discharge: 2021-10-23 | Disposition: A | Discharge status: Home / Self Care | Payer: 59 | Source: Ambulatory Visit | Attending: Internal Medicine | Admitting: Internal Medicine

## 2021-10-23 DIAGNOSIS — Z1231 Encounter for screening mammogram for malignant neoplasm of breast: Secondary | ICD-10-CM | POA: Diagnosis not present

## 2021-12-15 IMAGING — MG MM DIGITAL DIAGNOSTIC UNILAT*L* W/ TOMO W/ CAD
6 series · 6 of 18 positions shown · non-contrast
Comparison: September 26, 2019 and earlier priors

CLINICAL DATA: 68-year-old patient recalled from recent screening
mammogram for evaluation of possible distortion in the left breast.
This possible distortion was questioned only in the superior left
breast in the MLO projection.

EXAM:
DIGITAL DIAGNOSTIC LEFT MAMMOGRAM WITH CAD AND TOMO
ULTRASOUND LEFT BREAST

[L ML synth-2D]
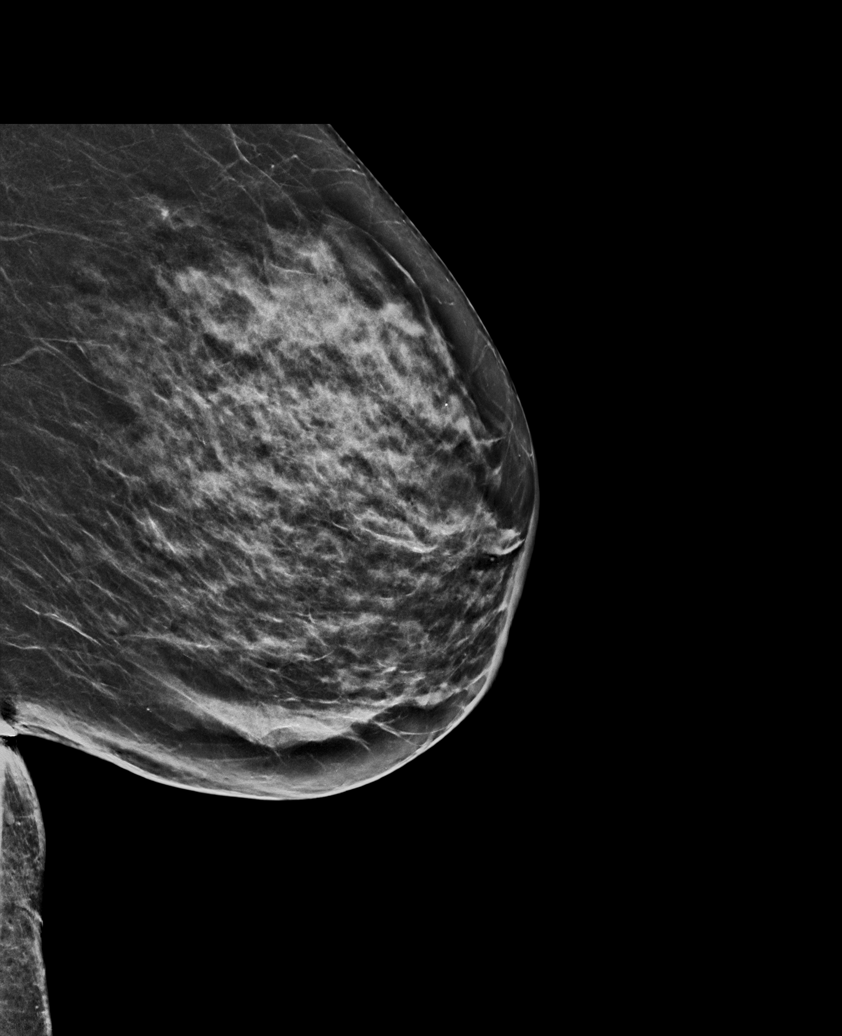

[L MLO synth-2D (1 of 2)]
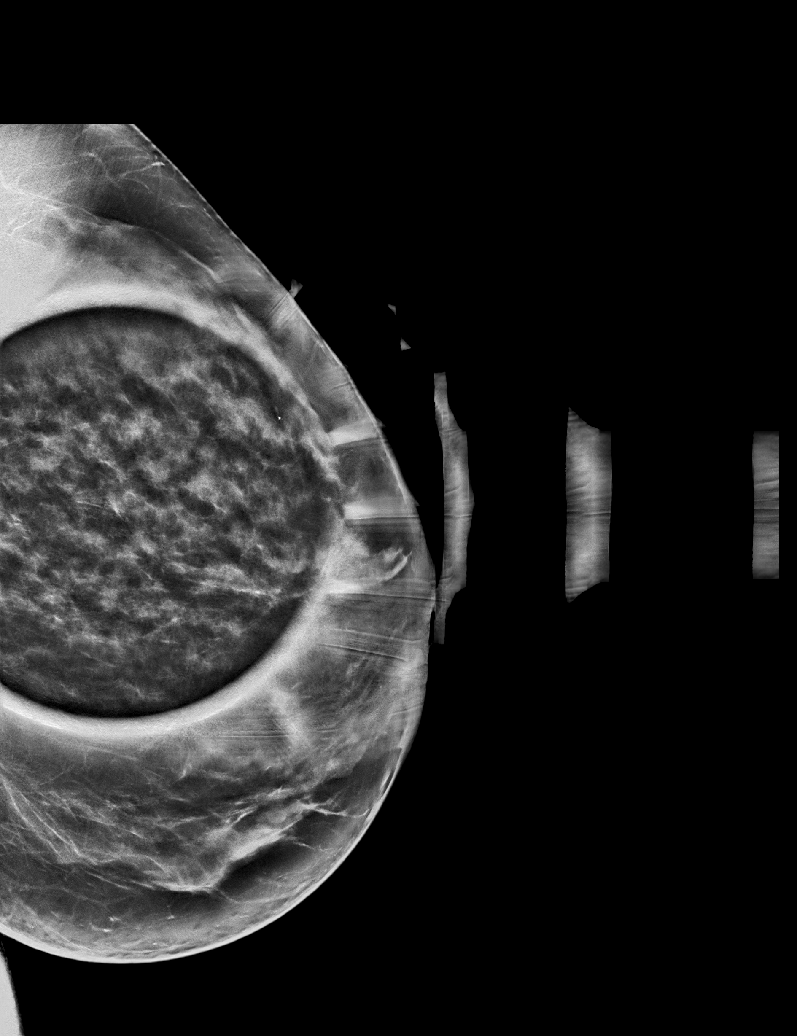

[L MLO synth-2D (2 of 2)]
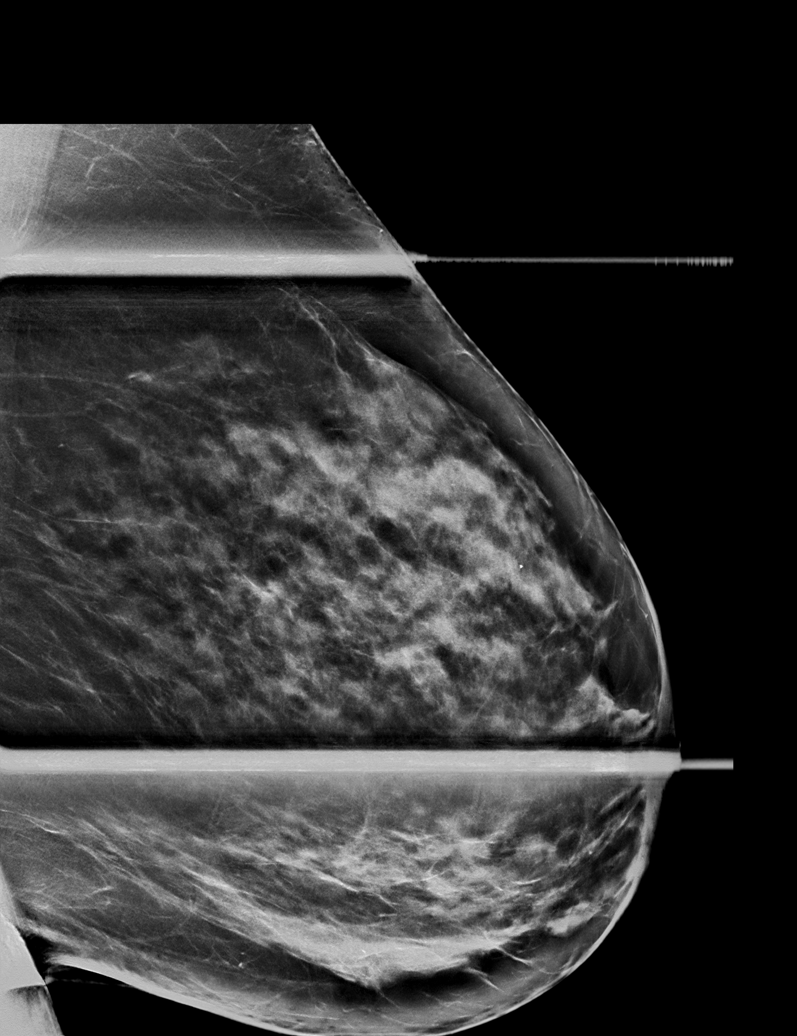

[L MLO tomo (1 of 2) · tomo slice 33/65.0]
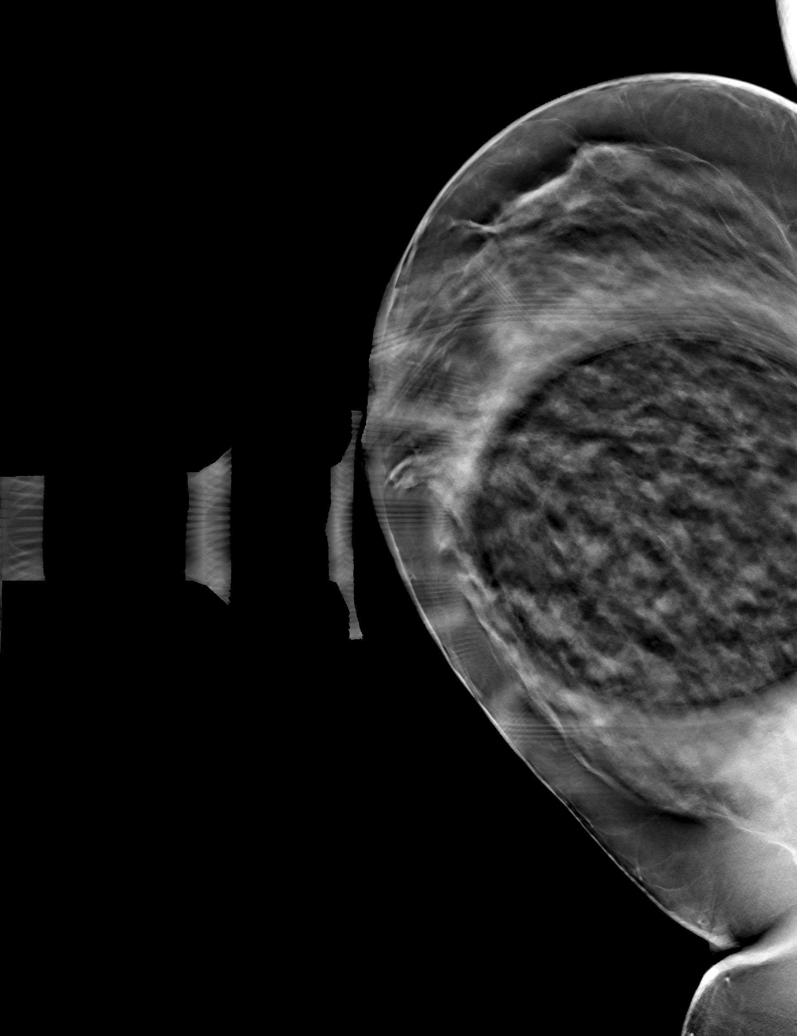

[L ML tomo · tomo slice 31/61.0]
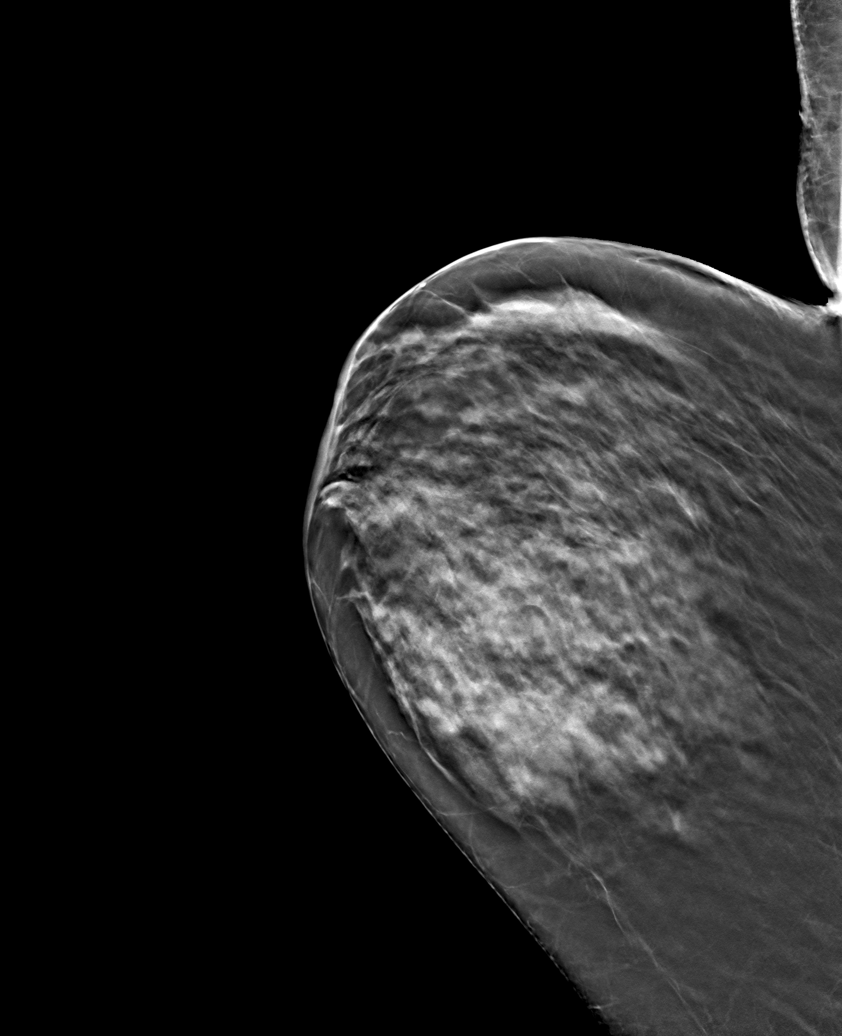

[L MLO tomo (2 of 2) · tomo slice 35/70.0]
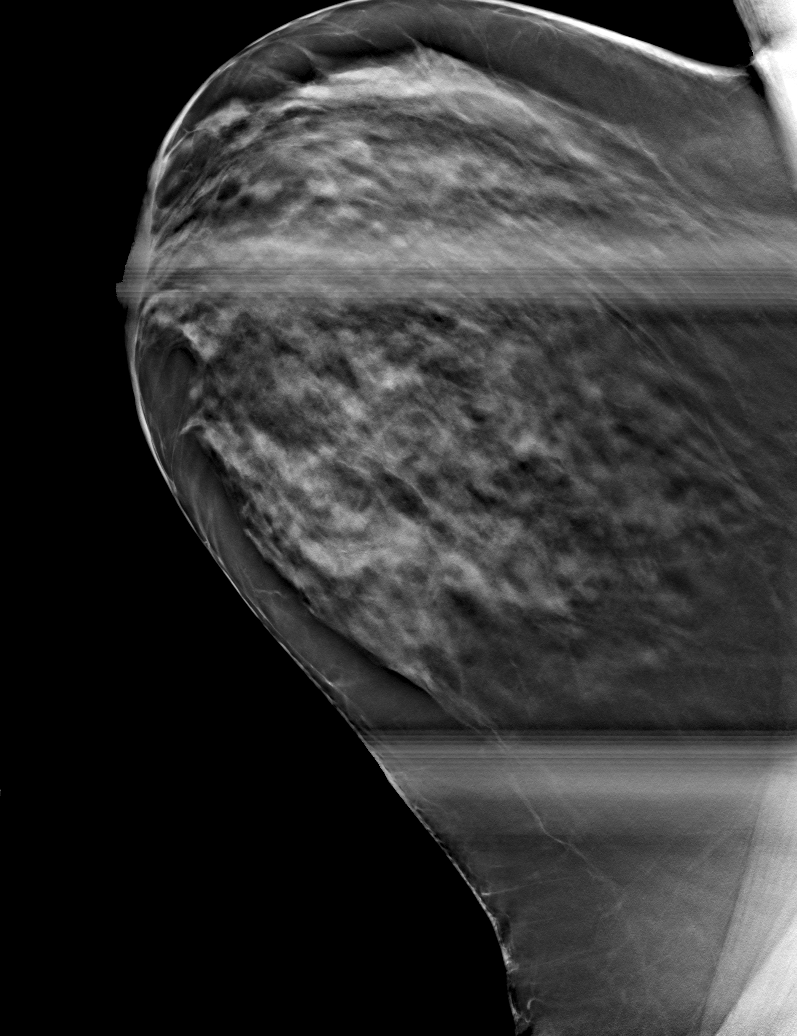

[6 of 18 positions shown; findings below may reference images not displayed]

ACR Breast Density Category c: The breast tissue is heterogeneously
dense, which may obscure small masses.
FINDINGS: Spot compression views of the left breast in the MLO projection and
a 90 degree lateral view of the left breast performed today with
tomography show no architectural distortion. The breast tissue is
dense, and ultrasound will also be performed.

Mammographic images were processed with CAD.

Targeted ultrasound is performed, showing normal dense tissue in the
superior left breast. No distortion, suspicious mass, or abnormal
shadowing is identified.
IMPRESSION: No evidence of malignancy in the left breast.

RECOMMENDATION:
Screening mammogram in one year.(Code:VS-B-9YF)

I have discussed the findings and recommendations with the patient.
If applicable, a reminder letter will be sent to the patient
regarding the next appointment.

BI-RADS CATEGORY  1: Negative.

## 2021-12-15 IMAGING — US US BREAST*L* LIMITED INC AXILLA
1 series · 6 of 6 positions shown · non-contrast
Comparison: September 26, 2019 and earlier priors

CLINICAL DATA: 68-year-old patient recalled from recent screening
mammogram for evaluation of possible distortion in the left breast.
This possible distortion was questioned only in the superior left
breast in the MLO projection.

EXAM:
DIGITAL DIAGNOSTIC LEFT MAMMOGRAM WITH CAD AND TOMO
ULTRASOUND LEFT BREAST

[Series 1: us breast*left* limited inc axilla · 0.08mm/px · 6 of 6 slices shown]
[im 1/6]
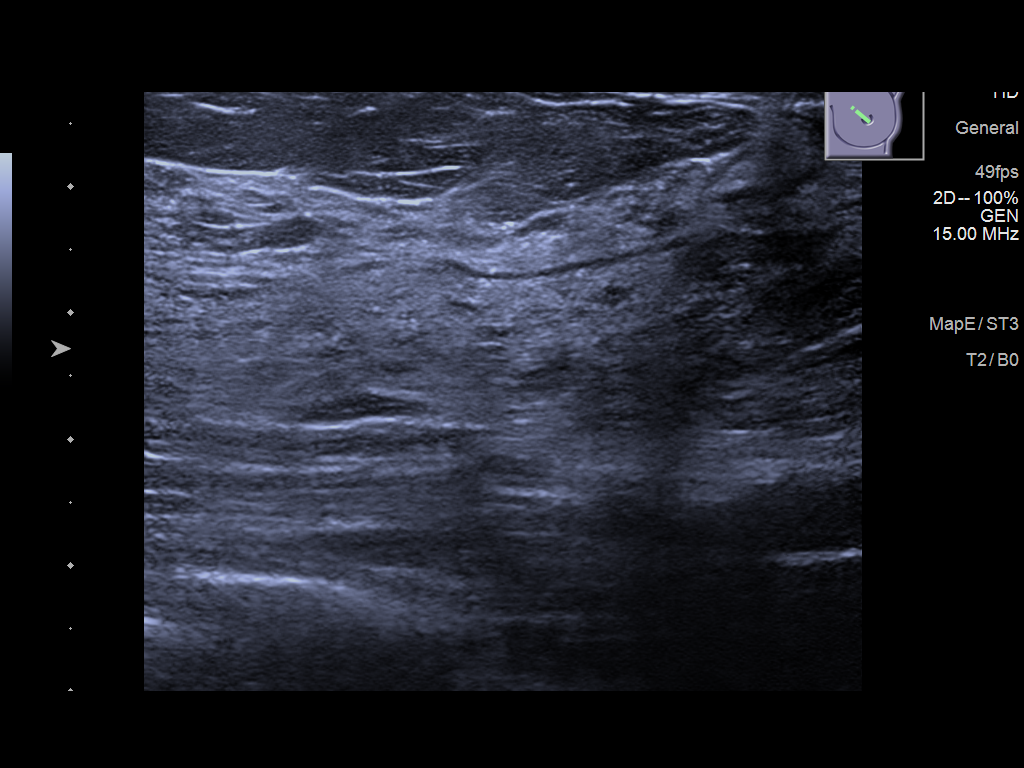
[im 2/6]
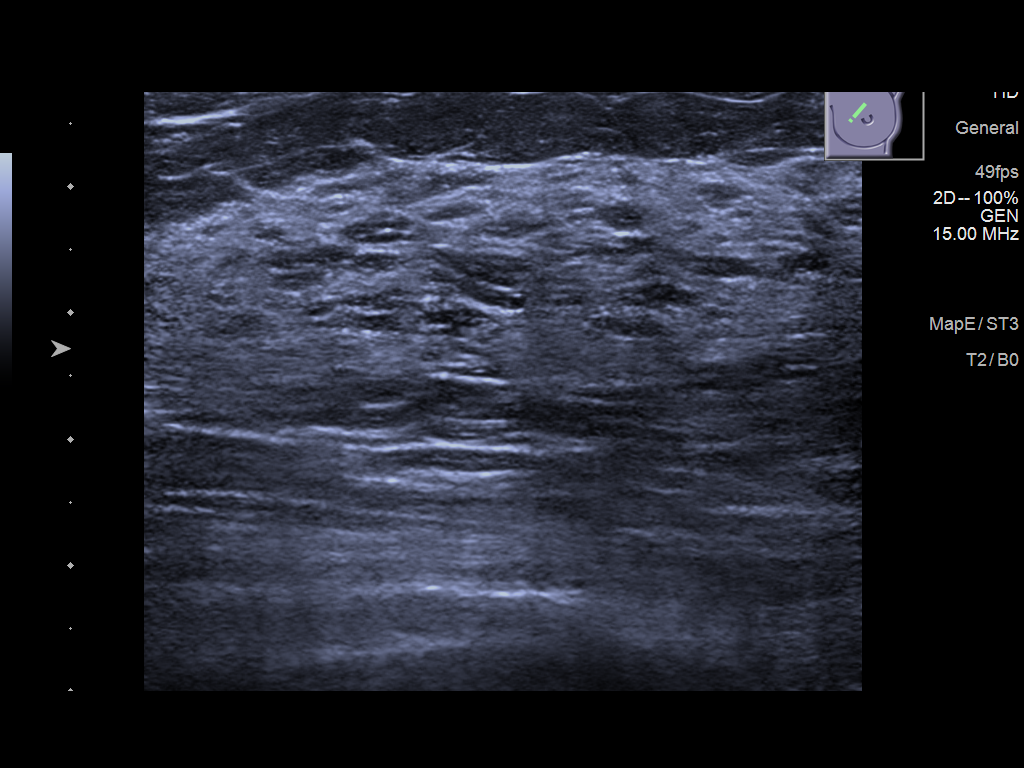
[im 3/6]
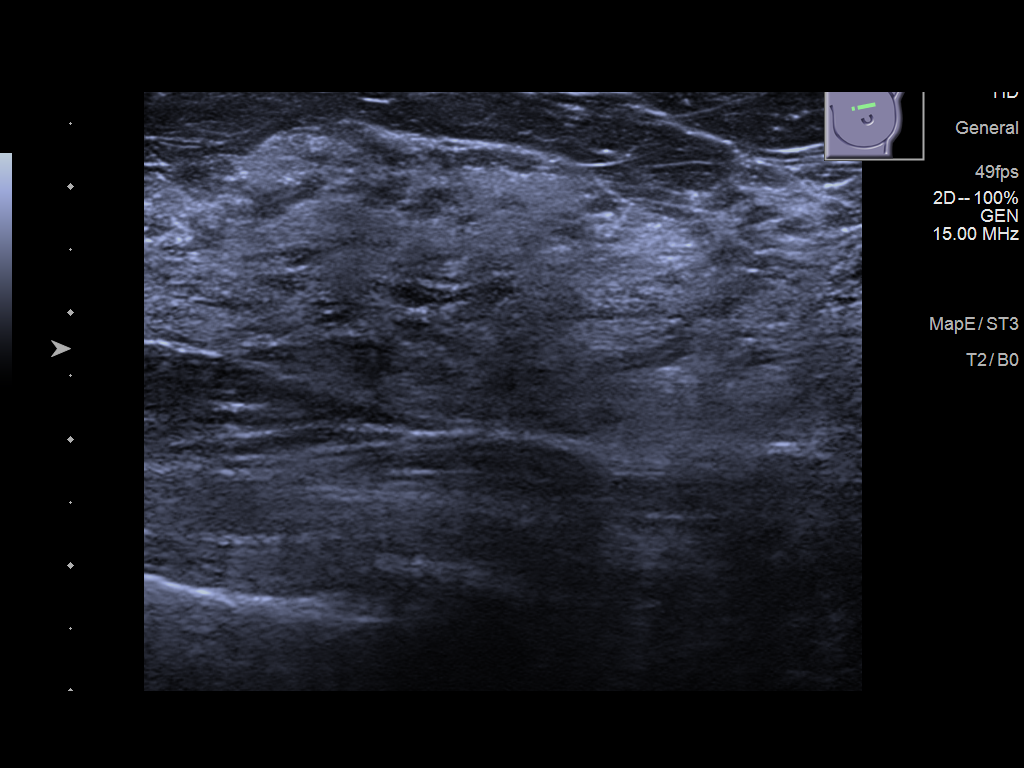
[im 4/6]
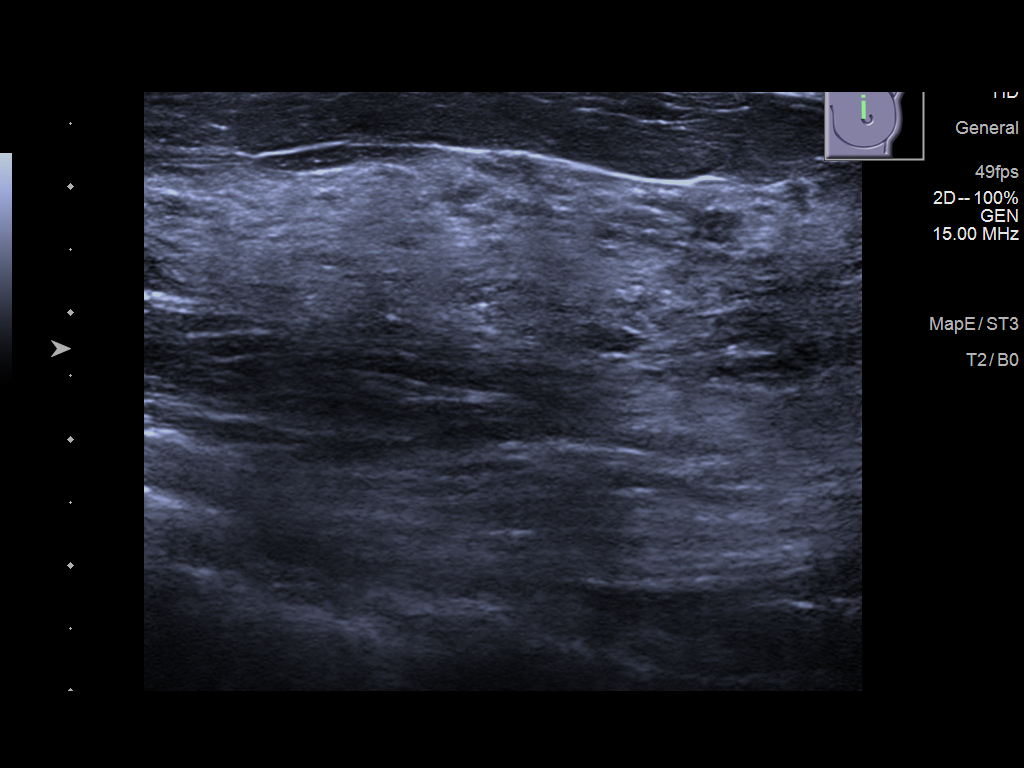
[im 5/6]
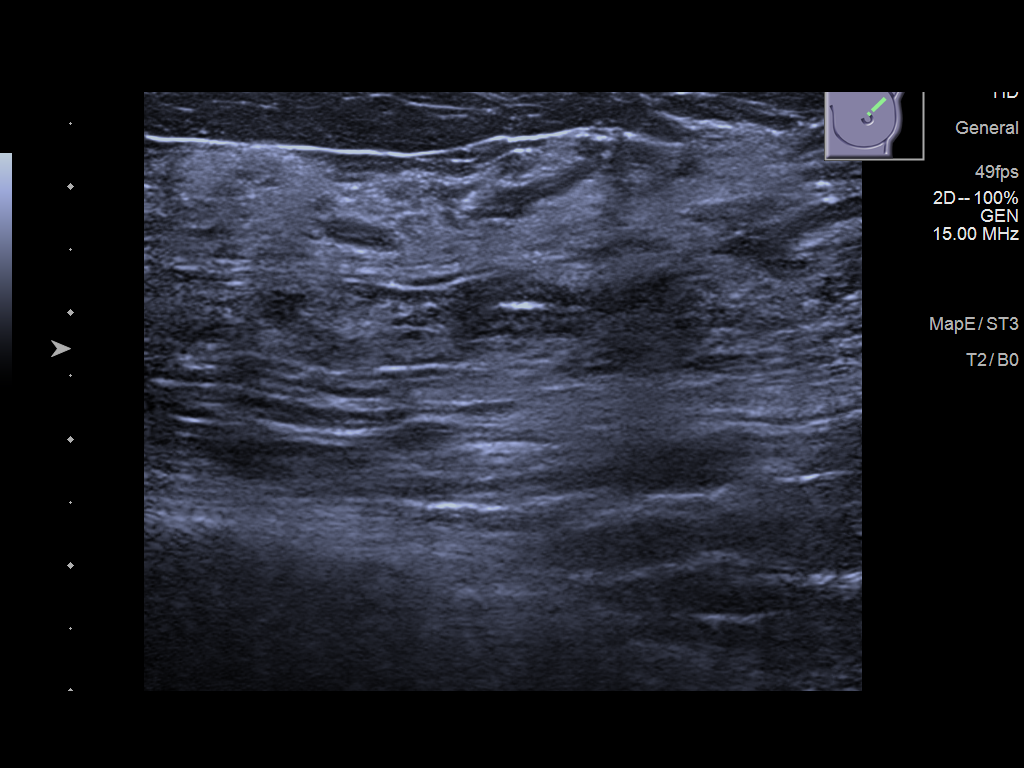
[im 6/6]
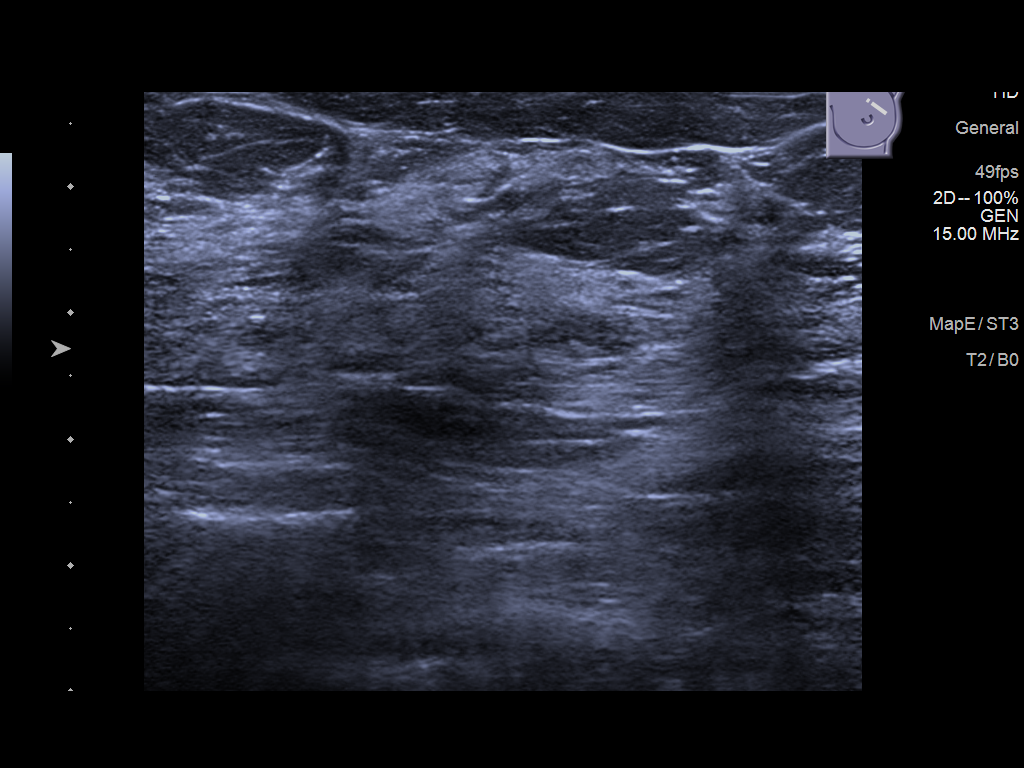

[6 of 6 positions shown; findings below may reference images not displayed]

ACR Breast Density Category c: The breast tissue is heterogeneously
dense, which may obscure small masses.
FINDINGS: Spot compression views of the left breast in the MLO projection and
a 90 degree lateral view of the left breast performed today with
tomography show no architectural distortion. The breast tissue is
dense, and ultrasound will also be performed.

Mammographic images were processed with CAD.

Targeted ultrasound is performed, showing normal dense tissue in the
superior left breast. No distortion, suspicious mass, or abnormal
shadowing is identified.
IMPRESSION: No evidence of malignancy in the left breast.

RECOMMENDATION:
Screening mammogram in one year.(Code:[id number]-9YF)

I have discussed the findings and recommendations with the patient.
If applicable, a reminder letter will be sent to the patient
regarding the next appointment.

BI-RADS CATEGORY  1: Negative.

## 2021-12-23 ENCOUNTER — Encounter: Payer: Self-pay | Admitting: Internal Medicine

## 2021-12-23 ENCOUNTER — Other Ambulatory Visit: Payer: Self-pay | Admitting: Internal Medicine

## 2021-12-24 MED ORDER — CLONIDINE HCL 0.2 MG PO TABS: 0.2000 mg | ORAL_TABLET | Freq: Every day | ORAL | 3 refills | Status: DC

## 2021-12-24 NOTE — Telephone Encounter (Signed)
Patient called and is out of medication of buPROPion (WELLBUTRIN XL) 150 MG 24 hr tablet and buPROPion (WELLBUTRIN XL) 300 MG 24 hr tablet

## 2022-03-25 ENCOUNTER — Other Ambulatory Visit: Payer: Self-pay | Admitting: Internal Medicine

## 2022-04-29 ENCOUNTER — Encounter: Payer: Self-pay | Admitting: Internal Medicine

## 2022-04-30 MED ORDER — MOLNUPIRAVIR EUA 200MG CAPSULE: 4.0000 | ORAL_CAPSULE | Freq: Two times a day (BID) | ORAL | 0 refills | Status: AC

## 2022-04-30 NOTE — Telephone Encounter (Signed)
Disregard. Message sent to patient:  Paxlovid I will not prescribe.  I will prescribe Molnupiravir,  but it  will not likely help the symptoms  you are  having but may reduce the duration.  .  Recommend treating the congestion with afrin nasal spray for 5 days,  saline irrigation Milta Deiters Meds sinus rinse) and tylenol for the headache.      The Rx was [place] to [facility] [place]  Regards,   Deborra Medina, MD

## 2022-05-14 ENCOUNTER — Ambulatory Visit: Payer: 59 | Admitting: Internal Medicine

## 2022-05-14 ENCOUNTER — Encounter: Payer: Self-pay | Admitting: Internal Medicine

## 2022-05-14 VITALS — BP 110/64 | HR 81 | Ht 65.25 in | Wt 142.8 lb

## 2022-05-14 DIAGNOSIS — E663 Overweight: Secondary | ICD-10-CM

## 2022-05-14 DIAGNOSIS — E559 Vitamin D deficiency, unspecified: Secondary | ICD-10-CM | POA: Diagnosis not present

## 2022-05-14 DIAGNOSIS — E785 Hyperlipidemia, unspecified: Secondary | ICD-10-CM

## 2022-05-14 DIAGNOSIS — R69 Illness, unspecified: Secondary | ICD-10-CM | POA: Diagnosis not present

## 2022-05-14 DIAGNOSIS — F339 Major depressive disorder, recurrent, unspecified: Secondary | ICD-10-CM

## 2022-05-14 DIAGNOSIS — N950 Postmenopausal bleeding: Secondary | ICD-10-CM | POA: Diagnosis not present

## 2022-05-14 DIAGNOSIS — Z0000 Encounter for general adult medical examination without abnormal findings: Secondary | ICD-10-CM | POA: Diagnosis not present

## 2022-05-14 DIAGNOSIS — R5383 Other fatigue: Secondary | ICD-10-CM | POA: Diagnosis not present

## 2022-05-14 DIAGNOSIS — R7303 Prediabetes: Secondary | ICD-10-CM | POA: Diagnosis not present

## 2022-05-14 MED ORDER — BUPROPION HCL ER (XL) 150 MG PO TB24: ORAL_TABLET | ORAL | 1 refills | Status: DC

## 2022-05-14 MED ORDER — BUPROPION HCL ER (XL) 300 MG PO TB24: ORAL_TABLET | ORAL | 0 refills | Status: DC

## 2022-05-14 MED ORDER — RSVPREF3 VAC RECOMB ADJUVANTED 120 MCG/0.5ML IM SUSR: 0.5000 mL | Freq: Once | INTRAMUSCULAR | 0 refills | Status: AC

## 2022-05-14 MED ORDER — ZOSTER VAC RECOMB ADJUVANTED 50 MCG/0.5ML IM SUSR: 0.5000 mL | Freq: Once | INTRAMUSCULAR | 1 refills | Status: AC

## 2022-05-14 NOTE — Progress Notes (Signed)
Patient ID: Ashley Ward, female    DOB: 28-May-1951  Age: 71 y.o. MRN: [id number]  The patient is here for annual preventive  examination and management of other chronic and acute problems.   The risk factors are reflected in the social history.  The roster of all physicians providing medical care to patient - is listed in the Snapshot section of the chart.  Activities of daily living:  The patient is 100% independent in all ADLs: dressing, toileting, feeding as well as independent mobility  Home safety : The patient has smoke detectors in the home. They wear seatbelts.  There are no firearms at home. There is no violence in the home.   There is no risks for hepatitis, STDs or HIV. There is no   history of blood transfusion. They have no travel history to infectious disease endemic areas of the world.  The patient has seen their dentist in the last six month. They have seen their eye doctor in the last year. They admit to slight hearing difficulty with regard to whispered voices and some television programs.  They have deferred audiologic testing in the last year.  They do not  have excessive sun exposure. Discussed the need for sun protection: hats, long sleeves and use of sunscreen if there is significant sun exposure.   Diet: the importance of a healthy diet is discussed. They do have a healthy diet.  The benefits of regular aerobic exercise were discussed. She walks 4 times per week ,  20 minutes.   Depression screen: there are no signs or vegative symptoms of depression- irritability, change in appetite, anhedonia, sadness/tearfullness.  Cognitive assessment: the patient manages all their financial and personal affairs and is actively engaged. They could relate day,date,year and events; recalled 2/3 objects at 3 minutes; performed clock-face test normally.  The following portions of the patient's history were reviewed and updated as appropriate: allergies, current medications, past  family history, past medical history,  past surgical history, past social history  and problem list.  Visual acuity was not assessed per patient preference since she has regular follow up with her ophthalmologist. Hearing and body mass index were assessed and reviewed.   During the course of the visit the patient was educated and counseled about appropriate screening and preventive services including : fall prevention , diabetes screening, nutrition counseling, colorectal cancer screening, and recommended immunizations.    CC: The primary encounter diagnosis was Hyperlipidemia LDL goal <160. Diagnoses of Prediabetes, Fatigue, unspecified type, Vitamin D deficiency, Overweight (BMI 25.0-29.9), Abnormal vaginal bleeding in postmenopausal patient, Encounter for preventive health examination, and Major depressive disorder, recurrent episode with melancholic features (HCC) were also pertinent to this visit.  1) intentional weight loss using Alwyn Ren, MD  Biosymmetry  (low carb diet  plus weekly doses of semiglutide ) has lost 40 lbs since February .now using it every other week to maintain.  Walking for exercise   2) Last DEXA 2020 :  osteopenia  3) depression : improving  wants to reduce dose  in spite of grief over loss of 2 family members   History Loanne has a past medical history of Hyperlipidemia, Menopause, and Menopause.   She has a past surgical history that includes Dilation and curettage, diagnostic / therapeutic (2007).   Her family history includes Alcohol abuse in her sister; Breast cancer in her cousin; Breast cancer (age of onset: 104) in her sister; COPD in her father; Cancer in her maternal grandfather, mother, and sister;  Diabetes in her mother; Hyperlipidemia in her cousin.She reports that she has never smoked. She has never used smokeless tobacco. She reports current alcohol use of about 4.0 standard drinks of alcohol per week. She reports that she does not use  drugs.  Outpatient Medications Prior to Visit  Medication Sig Dispense Refill   cloNIDine (CATAPRES) 0.2 MG tablet Take 1 tablet (0.2 mg total) by mouth daily. 90 tablet 3   ergocalciferol (VITAMIN D2) 1.25 MG (50000 UT) capsule Take 1 capsule (50,000 Units total) by mouth once a week. 4 capsule 2   Multiple Vitamins-Minerals (PRESERVISION AREDS PO) Take 2 capsules by mouth daily.     buPROPion (WELLBUTRIN XL) 150 MG 24 hr tablet TAKE ONE TABLET EVERY DAY ALONG WITH 300MG  FOR DAILY DOSE OF 450MG  30 tablet 1   buPROPion (WELLBUTRIN XL) 300 MG 24 hr tablet TAKE ONE TABLET EVERY DAY ALONG WITH 150MG  FOR DAILY DOSE OF 450MG  30 tablet 1   estradiol (ESTRACE VAGINAL) 0.1 MG/GM vaginal cream Place 1 Applicatorful vaginally at bedtime. For 2 weeks,  then reduce use to twice weekly (Patient not taking: Reported on 05/14/2022) 42.5 g 0   omeprazole (PRILOSEC) 40 MG capsule Take 1 capsule (40 mg total) by mouth daily. (Patient not taking: Reported on 05/23/2021) 30 capsule 3   traZODone (DESYREL) 50 MG tablet Take 0.5-1 tablets (25-50 mg total) by mouth at bedtime as needed for sleep. Increase as needed max dose 100 mg daily (Patient not taking: Reported on 05/14/2022) 90 tablet 3   No facility-administered medications prior to visit.    Review of Systems  Patient denies headache, fevers, malaise, unintentional weight loss, skin rash, eye pain, sinus congestion and sinus pain, sore throat, dysphagia,  hemoptysis , cough, dyspnea, wheezing, chest pain, palpitations, orthopnea, edema, abdominal pain, nausea, melena, diarrhea, constipation, flank pain, dysuria, hematuria, urinary  Frequency, nocturia, numbness, tingling, seizures,  Focal weakness, Loss of consciousness,  Tremor, insomnia, depression, anxiety, and suicidal ideation.    Objective:  BP 110/64   Pulse 81   Ht 5' 5.25" (1.657 m)   Wt 142 lb 12.8 oz (64.8 kg)   SpO2 99%   BMI 23.58 kg/m   Physical Exam  General appearance: alert,  cooperative and appears stated age Head: Normocephalic, without obvious abnormality, atraumatic Eyes: conjunctivae/corneas clear. PERRL, EOM's intact. Fundi benign. Ears: normal TM's and external ear canals both ears Nose: Nares normal. Septum midline. Mucosa normal. No drainage or sinus tenderness. Throat: lips, mucosa, and tongue normal; teeth and gums normal Neck: no adenopathy, no carotid bruit, no JVD, supple, symmetrical, trachea midline and thyroid not enlarged, symmetric, no tenderness/mass/nodules Lungs: clear to auscultation bilaterally Breasts: normal appearance, no masses or tenderness Heart: regular rate and rhythm, S1, S2 normal, no murmur, click, rub or gallop Abdomen: soft, non-tender; bowel sounds normal; no masses,  no organomegaly Extremities: extremities normal, atraumatic, no cyanosis or edema Pulses: 2+ and symmetric Skin: Skin color, texture, turgor normal. No rashes or lesions Neurologic: Alert and oriented X 3, normal strength and tone. Normal symmetric reflexes. Normal coordination and gait.    Assessment & Plan:   Problem List Items Addressed This Visit     Vitamin D deficiency   Relevant Orders   VITAMIN D 25 Hydroxy (Vit-D Deficiency, Fractures) (Completed)   Prediabetes   Relevant Orders   Comprehensive metabolic panel (Completed)   Overweight (BMI 25.0-29.9)    I have congratulated her in the loss of 42 lbs using semiglutide and a low glycemic  index diet prescribed by an MD in [facility]) .  I have  encouraged her to begin exercising regularly .        Major depressive disorder, recurrent episode with melancholic features (HCC)    Secondary to complicated grief following the loss of her grandchild 6 years ago.. she has requested advice  in weaning off of medication, which I have advised her to do after the holidays       Relevant Medications   buPROPion (WELLBUTRIN XL) 150 MG 24 hr tablet   buPROPion (WELLBUTRIN XL) 300 MG 24 hr tablet    Hyperlipidemia LDL goal <160 - Primary   Relevant Orders   LDL cholesterol, direct   Lipid panel (Completed)   Encounter for preventive health examination    age appropriate education and counseling updated, referrals for preventative services and immunizations addressed, dietary and smoking counseling addressed, most recent labs reviewed.  I have personally reviewed and have noted:   1) the patient's medical and social history 2) The pt's use of alcohol, tobacco, and illicit drugs 3) The patient's current medications and supplements 4) Functional ability including ADL's, fall risk, home safety risk, hearing and visual impairment 5) Diet and physical activities 6) Evidence for depression or mood disorder 7) The patient's height, weight, and BMI have been recorded in the chartin sinus rhythm.     I have made referrals, and provided counseling and education based on review of the above       Abnormal vaginal bleeding in postmenopausal patient    She was referred to GYN last year.  HRT was stopped and PMB has stopped       Other Visit Diagnoses     Fatigue, unspecified type       Relevant Orders   CBC with Differential/Platelet (Completed)   TSH (Completed)       I have discontinued Leanor C. Khachatryan's omeprazole, traZODone, and estradiol. I am also having her start on RSV vaccine recomb adjuvanted and Zoster Vaccine Adjuvanted. Additionally, I am having her maintain her Multiple Vitamins-Minerals (PRESERVISION AREDS PO), ergocalciferol, cloNIDine, buPROPion, and buPROPion.    I provided 40 minutes of  face-to-face time during this encounter reviewing patient's current problems and past surgeries,  recent labs and imaging studies, providing counseling on the above mentioned problems , and coordination  of care .   Follow-up: Return in about 6 months (around 11/12/2022).   Sherlene Shams, MD

## 2022-05-14 NOTE — Assessment & Plan Note (Signed)
I have congratulated her in the loss of 42 lbs using semiglutide and a low glycemic index diet prescribed by an MD in [facility]) .  I have  encouraged her to begin exercising regularly .

## 2022-05-14 NOTE — Patient Instructions (Addendum)
1) You look fabulous!   2)  I recommend the Shingrix vaccines and the RSV vaccine,  which you can get at your pharmacy.  They can both cause flu like symptoms so plan ahead.  (Don't get them at the same time)  3) Wait until after the holidays  to start weaning the wellbutrin dose as follows:  Reduce dose to 300 mg daily for 2 weeks,  then 150 mg daily for two weeks, then 150 mg every other day for 2 weeks,  then stop

## 2022-05-15 ENCOUNTER — Encounter: Payer: Self-pay | Admitting: Internal Medicine

## 2022-05-15 LAB — LIPID PANEL
Cholesterol: 202 mg/dL — ABNORMAL HIGH (ref 0–200)
HDL: 61.4 mg/dL (ref >39.00)
LDL Cholesterol: 118 mg/dL — ABNORMAL HIGH (ref 0–99)
NonHDL: 140.72
Total CHOL/HDL Ratio: 3
Triglycerides: 113 mg/dL (ref 0.0–149.0)
VLDL: 22.6 mg/dL (ref 0.0–40.0)

## 2022-05-15 LAB — COMPREHENSIVE METABOLIC PANEL
ALT: 11 U/L (ref 0–35)
AST: 11 U/L (ref 0–37)
Albumin: 4.2 g/dL (ref 3.5–5.2)
Alkaline Phosphatase: 50 U/L (ref 39–117)
BUN: 18 mg/dL (ref 6–23)
CO2: 31 mEq/L (ref 19–32)
Calcium: 8.8 mg/dL (ref 8.4–10.5)
Chloride: 105 mEq/L (ref 96–112)
Creatinine, Ser: 0.99 mg/dL (ref 0.40–1.20)
GFR: 57.45 mL/min — ABNORMAL LOW (ref >60.00)
Glucose, Bld: 79 mg/dL (ref 70–99)
Potassium: 3.8 mEq/L (ref 3.5–5.1)
Sodium: 142 mEq/L (ref 135–145)
Total Bilirubin: 0.4 mg/dL (ref 0.2–1.2)
Total Protein: 6.2 g/dL (ref 6.0–8.3)

## 2022-05-15 LAB — CBC WITH DIFFERENTIAL/PLATELET
Basophils Absolute: 0.1 10*3/uL (ref 0.0–0.1)
Basophils Relative: 1 % (ref 0.0–3.0)
Eosinophils Absolute: 0.1 10*3/uL (ref 0.0–0.7)
Eosinophils Relative: 1.7 % (ref 0.0–5.0)
HCT: 39.9 % (ref 36.0–46.0)
Hemoglobin: 13.3 g/dL (ref 12.0–15.0)
Lymphocytes Relative: 36 % (ref 12.0–46.0)
Lymphs Abs: 2 10*3/uL (ref 0.7–4.0)
MCHC: 33.4 g/dL (ref 30.0–36.0)
MCV: 94.7 fl (ref 78.0–100.0)
Monocytes Absolute: 0.7 10*3/uL (ref 0.1–1.0)
Monocytes Relative: 11.6 % (ref 3.0–12.0)
Neutro Abs: 2.8 10*3/uL (ref 1.4–7.7)
Neutrophils Relative %: 49.7 % (ref 43.0–77.0)
Platelets: 328 10*3/uL (ref 150.0–400.0)
RBC: 4.22 Mil/uL (ref 3.87–5.11)
RDW: 12.4 % (ref 11.5–15.5)
WBC: 5.6 10*3/uL (ref 4.0–10.5)

## 2022-05-15 LAB — VITAMIN D 25 HYDROXY (VIT D DEFICIENCY, FRACTURES): VITD: 35.44 ng/mL (ref 30.00–100.00)

## 2022-05-15 LAB — TSH: TSH: 2.32 u[IU]/mL (ref 0.35–5.50)

## 2022-05-15 NOTE — Assessment & Plan Note (Signed)
She was referred to GYN last year.  HRT was stopped and PMB has stopped

## 2022-05-15 NOTE — Assessment & Plan Note (Signed)
Secondary to complicated grief following the loss of her grandchild 6 years ago.. she has requested advice  in weaning off of medication, which I have advised her to do after the holidays

## 2022-05-15 NOTE — Assessment & Plan Note (Signed)
age appropriate education and counseling updated, referrals for preventative services and immunizations addressed, dietary and smoking counseling addressed, most recent labs reviewed.  I have personally reviewed and have noted:   1) the patient's medical and social history 2) The pt's use of alcohol, tobacco, and illicit drugs 3) The patient's current medications and supplements 4) Functional ability including ADL's, fall risk, home safety risk, hearing and visual impairment 5) Diet and physical activities 6) Evidence for depression or mood disorder 7) The patient's height, weight, and BMI have been recorded in the chartin sinus rhythm.     I have made referrals, and provided counseling and education based on review of the above

## 2022-06-20 DIAGNOSIS — T887XXA Unspecified adverse effect of drug or medicament, initial encounter: Secondary | ICD-10-CM | POA: Diagnosis not present

## 2022-06-20 DIAGNOSIS — L570 Actinic keratosis: Secondary | ICD-10-CM | POA: Diagnosis not present

## 2022-07-09 DIAGNOSIS — H5203 Hypermetropia, bilateral: Secondary | ICD-10-CM | POA: Diagnosis not present

## 2022-07-09 DIAGNOSIS — H2513 Age-related nuclear cataract, bilateral: Secondary | ICD-10-CM | POA: Diagnosis not present

## 2022-07-09 DIAGNOSIS — H35363 Drusen (degenerative) of macula, bilateral: Secondary | ICD-10-CM | POA: Diagnosis not present

## 2022-07-09 DIAGNOSIS — H524 Presbyopia: Secondary | ICD-10-CM | POA: Diagnosis not present

## 2022-08-07 ENCOUNTER — Encounter: Payer: Self-pay | Admitting: Internal Medicine

## 2022-08-09 MED ORDER — BUPROPION HCL ER (XL) 150 MG PO TB24: ORAL_TABLET | ORAL | 0 refills | Status: DC

## 2022-08-11 ENCOUNTER — Telehealth: Payer: Self-pay

## 2022-08-11 MED ORDER — BUPROPION HCL ER (XL) 150 MG PO TB24: ORAL_TABLET | ORAL | 0 refills | Status: DC

## 2022-08-12 ENCOUNTER — Other Ambulatory Visit: Payer: Self-pay

## 2022-08-12 NOTE — Telephone Encounter (Signed)
[facility]

## 2022-08-12 NOTE — Telephone Encounter (Signed)
Pt need a refill on buPROPion '300mg'$  sent to [facility]

## 2022-08-25 ENCOUNTER — Encounter: Payer: Self-pay | Admitting: Internal Medicine

## 2022-08-26 NOTE — Progress Notes (Unsigned)
  Tomasita Morrow, NP-C Phone: [phone]  Ashley Ward is a 72 y.o. female who presents today for UTI symptoms.   Patient reports symptoms began approximately 4 days ago. She has been taking Azo for the last 2 days, with relief of painful urination. Reports bladder pressure. Denies back pain.   UTI: Dysuria- Yes Frequency- Yes  Urgency- Yes  Hematuria- No  Fever- No Abd pain- No  Vaginal d/c- No  Social History   Tobacco Use  Smoking Status Never  Smokeless Tobacco Never    Current Outpatient Medications on File Prior to Visit  Medication Sig Dispense Refill   buPROPion (WELLBUTRIN XL) 150 MG 24 hr tablet TAKE ONE TABLET EVERY DAY ALONG WITH 300MG  FOR DAILY DOSE OF 450MG  30 tablet 0   cloNIDine (CATAPRES) 0.2 MG tablet Take 1 tablet (0.2 mg total) by mouth daily. 90 tablet 3   ergocalciferol (VITAMIN D2) 1.25 MG (50000 UT) capsule Take 1 capsule (50,000 Units total) by mouth once a week. 4 capsule 2   Multiple Vitamins-Minerals (PRESERVISION AREDS PO) Take 2 capsules by mouth daily.     No current facility-administered medications on file prior to visit.    ROS see history of present illness  Objective  Physical Exam Vitals:   08/27/22 0845  BP: 116/68  Pulse: 75  Temp: 97.9 F (36.6 C)  SpO2: 97%    BP Readings from Last 3 Encounters:  08/27/22 116/68  05/14/22 110/64  04/01/21 128/80   Wt Readings from Last 3 Encounters:  08/27/22 135 lb 9.6 oz (61.5 kg)  05/14/22 142 lb 12.8 oz (64.8 kg)  05/23/21 186 lb (84.4 kg)    Physical Exam Constitutional:      General: She is not in acute distress.    Appearance: Normal appearance.  HENT:     Head: Normocephalic.  Cardiovascular:     Rate and Rhythm: Normal rate and regular rhythm.     Heart sounds: Normal heart sounds.  Pulmonary:     Effort: Pulmonary effort is normal.     Breath sounds: Normal breath sounds.  Skin:    General: Skin is warm and dry.  Neurological:     General: No focal deficit present.      Mental Status: She is alert.  Psychiatric:        Mood and Affect: Mood normal.        Behavior: Behavior normal.    Assessment/Plan: Please see individual problem list.  Dysuria Assessment & Plan: Unable to complete POCT UA in office due to recent use of Azo. Will send urine for microscopic and culture. Will start patient on Macrobid 100 BID x 5 days and contact her with results of culture. Encouraged adequate fluid intake.   Orders: -     Urinalysis, Routine w reflex microscopic -     Urine Culture -     Nitrofurantoin Monohyd Macro; Take 1 capsule (100 mg total) by mouth 2 (two) times daily.  Dispense: 10 capsule; Refill: 0  Screening mammogram for breast cancer -     3D Screening Mammogram, Left and Right; Future   Return if symptoms worsen or fail to improve.   Tomasita Morrow, NP-C [place]

## 2022-08-27 ENCOUNTER — Encounter: Payer: Self-pay | Admitting: Nurse Practitioner

## 2022-08-27 ENCOUNTER — Ambulatory Visit: Payer: 59 | Admitting: Nurse Practitioner

## 2022-08-27 VITALS — BP 116/68 | HR 75 | Temp 97.9°F | Ht 65.25 in | Wt 135.6 lb

## 2022-08-27 DIAGNOSIS — Z1231 Encounter for screening mammogram for malignant neoplasm of breast: Secondary | ICD-10-CM

## 2022-08-27 DIAGNOSIS — R300 Dysuria: Secondary | ICD-10-CM | POA: Diagnosis not present

## 2022-08-27 LAB — URINALYSIS, ROUTINE W REFLEX MICROSCOPIC

## 2022-08-27 MED ORDER — NITROFURANTOIN MONOHYD MACRO 100 MG PO CAPS: 100.0000 mg | ORAL_CAPSULE | Freq: Two times a day (BID) | ORAL | 0 refills | Status: DC

## 2022-08-27 NOTE — Patient Instructions (Signed)
YOUR MAMMOGRAM IS DUE, PLEASE CALL AND GET THIS SCHEDULED! [place] - call [phone]   It was nice to meet you.   Your mammogram is due in May, I have placed the order. Please contact the number above to schedule.  I have sent in an antibiotic for you to start taking for your urinary symptoms.   I will contact you with the results of your urine culture.   Please make sure you are drinking plenty of fluids.

## 2022-08-27 NOTE — Assessment & Plan Note (Signed)
Unable to complete POCT UA in office due to recent use of Azo. Will send urine for microscopic and culture. Will start patient on Macrobid 100 BID x 5 days and contact her with results of culture. Encouraged adequate fluid intake.

## 2022-08-29 LAB — URINE CULTURE
MICRO NUMBER:: 14687372
SPECIMEN QUALITY:: ADEQUATE

## 2022-09-25 ENCOUNTER — Other Ambulatory Visit: Payer: Self-pay | Admitting: Internal Medicine

## 2022-09-25 DIAGNOSIS — X32XXXA Exposure to sunlight, initial encounter: Secondary | ICD-10-CM | POA: Diagnosis not present

## 2022-09-25 DIAGNOSIS — L821 Other seborrheic keratosis: Secondary | ICD-10-CM | POA: Diagnosis not present

## 2022-09-25 DIAGNOSIS — B351 Tinea unguium: Secondary | ICD-10-CM | POA: Diagnosis not present

## 2022-09-25 DIAGNOSIS — L400 Psoriasis vulgaris: Secondary | ICD-10-CM | POA: Diagnosis not present

## 2022-09-25 DIAGNOSIS — D2371 Other benign neoplasm of skin of right lower limb, including hip: Secondary | ICD-10-CM | POA: Diagnosis not present

## 2022-09-25 DIAGNOSIS — L570 Actinic keratosis: Secondary | ICD-10-CM | POA: Diagnosis not present

## 2022-09-27 ENCOUNTER — Other Ambulatory Visit: Payer: Self-pay

## 2022-09-27 DIAGNOSIS — K8010 Calculus of gallbladder with chronic cholecystitis without obstruction: Principal | ICD-10-CM | POA: Insufficient documentation

## 2022-09-27 DIAGNOSIS — Z79899 Other long term (current) drug therapy: Secondary | ICD-10-CM | POA: Diagnosis not present

## 2022-09-27 DIAGNOSIS — K824 Cholesterolosis of gallbladder: Secondary | ICD-10-CM | POA: Diagnosis not present

## 2022-09-27 DIAGNOSIS — K8020 Calculus of gallbladder without cholecystitis without obstruction: Secondary | ICD-10-CM | POA: Diagnosis not present

## 2022-09-27 DIAGNOSIS — R9431 Abnormal electrocardiogram [ECG] [EKG]: Secondary | ICD-10-CM | POA: Diagnosis not present

## 2022-09-27 DIAGNOSIS — K810 Acute cholecystitis: Secondary | ICD-10-CM | POA: Diagnosis not present

## 2022-09-27 DIAGNOSIS — R1011 Right upper quadrant pain: Secondary | ICD-10-CM | POA: Diagnosis not present

## 2022-09-27 DIAGNOSIS — R1010 Upper abdominal pain, unspecified: Secondary | ICD-10-CM | POA: Diagnosis not present

## 2022-09-27 NOTE — ED Triage Notes (Signed)
Pt states that she had sudden onset of sharp, constant upper abdominal pain that began at approx 1600 but has progressively worsened. Denies nausea, vomiting, diarrhea. Pt reports pain is "10/10"

## 2022-09-28 ENCOUNTER — Observation Stay: Payer: 59 | Admitting: Anesthesiology

## 2022-09-28 ENCOUNTER — Observation Stay
Admission: EM | Admit: 2022-09-28 | Discharge: 2022-09-28 | Disposition: A | Discharge status: Home / Self Care | Payer: 59 | Attending: Surgery | Admitting: Surgery

## 2022-09-28 ENCOUNTER — Encounter
Admission: EM | Disposition: A | Discharge status: Home / Self Care | Payer: Self-pay | Source: Home / Self Care | Attending: Emergency Medicine

## 2022-09-28 ENCOUNTER — Emergency Department: Payer: 59

## 2022-09-28 ENCOUNTER — Other Ambulatory Visit: Payer: Self-pay

## 2022-09-28 DIAGNOSIS — K8020 Calculus of gallbladder without cholecystitis without obstruction: Secondary | ICD-10-CM | POA: Diagnosis not present

## 2022-09-28 DIAGNOSIS — R1010 Upper abdominal pain, unspecified: Secondary | ICD-10-CM

## 2022-09-28 DIAGNOSIS — K8010 Calculus of gallbladder with chronic cholecystitis without obstruction: Secondary | ICD-10-CM | POA: Diagnosis not present

## 2022-09-28 DIAGNOSIS — R1011 Right upper quadrant pain: Secondary | ICD-10-CM | POA: Diagnosis not present

## 2022-09-28 DIAGNOSIS — K810 Acute cholecystitis: Secondary | ICD-10-CM | POA: Diagnosis not present

## 2022-09-28 DIAGNOSIS — K824 Cholesterolosis of gallbladder: Secondary | ICD-10-CM | POA: Diagnosis not present

## 2022-09-28 LAB — CBC WITH DIFFERENTIAL/PLATELET
Abs Immature Granulocytes: 0.04 10*3/uL (ref 0.00–0.07)
Basophils Absolute: 0 10*3/uL (ref 0.0–0.1)
Basophils Relative: 0 %
Eosinophils Absolute: 0 10*3/uL (ref 0.0–0.5)
Eosinophils Relative: 0 %
HCT: 38.8 % (ref 36.0–46.0)
Hemoglobin: 13 g/dL (ref 12.0–15.0)
Immature Granulocytes: 0 %
Lymphocytes Relative: 21 %
Lymphs Abs: 1.9 10*3/uL (ref 0.7–4.0)
MCH: 31 pg (ref 26.0–34.0)
MCHC: 33.5 g/dL (ref 30.0–36.0)
MCV: 92.6 fL (ref 80.0–100.0)
Monocytes Absolute: 0.7 10*3/uL (ref 0.1–1.0)
Monocytes Relative: 7 %
Neutro Abs: 6.4 10*3/uL (ref 1.7–7.7)
Neutrophils Relative %: 72 %
Platelets: 224 10*3/uL (ref 150–400)
RBC: 4.19 MIL/uL (ref 3.87–5.11)
RDW: 11.9 % (ref 11.5–15.5)
WBC: 9.1 10*3/uL (ref 4.0–10.5)
nRBC: 0 % (ref 0.0–0.2)

## 2022-09-28 LAB — BILIRUBIN, FRACTIONATED(TOT/DIR/INDIR)
Bilirubin, Direct: 0.5 mg/dL — ABNORMAL HIGH (ref 0.0–0.2)
Indirect Bilirubin: 0.9 mg/dL (ref 0.3–0.9)
Total Bilirubin: 1.4 mg/dL — ABNORMAL HIGH (ref 0.3–1.2)

## 2022-09-28 LAB — COMPREHENSIVE METABOLIC PANEL
ALT: 57 U/L — ABNORMAL HIGH (ref 0–44)
AST: 128 U/L — ABNORMAL HIGH (ref 15–41)
Albumin: 4 g/dL (ref 3.5–5.0)
Alkaline Phosphatase: 64 U/L (ref 38–126)
Anion gap: 9 (ref 5–15)
BUN: 25 mg/dL — ABNORMAL HIGH (ref 8–23)
CO2: 26 mmol/L (ref 22–32)
Calcium: 9.4 mg/dL (ref 8.9–10.3)
Chloride: 103 mmol/L (ref 98–111)
Creatinine, Ser: 0.79 mg/dL (ref 0.44–1.00)
GFR, Estimated: >60 mL/min (ref >60)
Glucose, Bld: 134 mg/dL — ABNORMAL HIGH (ref 70–99)
Potassium: 4.1 mmol/L (ref 3.5–5.1)
Sodium: 138 mmol/L (ref 135–145)
Total Bilirubin: 1.4 mg/dL — ABNORMAL HIGH (ref 0.3–1.2)
Total Protein: 6.9 g/dL (ref 6.5–8.1)

## 2022-09-28 LAB — URINALYSIS, W/ REFLEX TO CULTURE (INFECTION SUSPECTED)
Bacteria, UA: NONE SEEN
Bilirubin Urine: NEGATIVE
Glucose, UA: NEGATIVE mg/dL
Hgb urine dipstick: NEGATIVE
Ketones, ur: NEGATIVE mg/dL
Leukocytes,Ua: NEGATIVE
Nitrite: NEGATIVE
Protein, ur: NEGATIVE mg/dL
Specific Gravity, Urine: 1.01 (ref 1.005–1.030)
WBC, UA: NONE SEEN WBC/hpf (ref 0–5)
pH: 8 (ref 5.0–8.0)

## 2022-09-28 LAB — LIPASE, BLOOD: Lipase: 30 U/L (ref 11–51)

## 2022-09-28 LAB — TROPONIN I (HIGH SENSITIVITY): Troponin I (High Sensitivity): 3 ng/L (ref <18)

## 2022-09-28 LAB — LACTIC ACID, PLASMA: Lactic Acid, Venous: 1 mmol/L (ref 0.5–1.9)

## 2022-09-28 SURGERY — CHOLECYSTECTOMY, ROBOT-ASSISTED, LAPAROSCOPIC
Anesthesia: General | Site: Abdomen

## 2022-09-28 MED ORDER — KETOROLAC TROMETHAMINE 30 MG/ML IJ SOLN
INTRAMUSCULAR | Status: DC | PRN
  Administered 2022-09-28: 30 mg via INTRAVENOUS

## 2022-09-28 MED ORDER — DROPERIDOL 2.5 MG/ML IJ SOLN
INTRAMUSCULAR | Status: AC
  Filled 2022-09-28: qty 2

## 2022-09-28 MED ORDER — SODIUM CHLORIDE 0.9 % IV SOLN
2.0000 g | INTRAVENOUS | Status: DC
  Administered 2022-09-28: 2 g via INTRAVENOUS
  Filled 2022-09-28 (×2): qty 20

## 2022-09-28 MED ORDER — ROCURONIUM BROMIDE 100 MG/10ML IV SOLN
INTRAVENOUS | Status: DC | PRN
  Administered 2022-09-28: 40 mg via INTRAVENOUS
  Administered 2022-09-28: 20 mg via INTRAVENOUS

## 2022-09-28 MED ORDER — PROCHLORPERAZINE MALEATE 10 MG PO TABS: 10.0000 mg | ORAL_TABLET | Freq: Four times a day (QID) | ORAL | Status: DC | PRN

## 2022-09-28 MED ORDER — KETAMINE HCL 50 MG/5ML IJ SOSY
PREFILLED_SYRINGE | INTRAMUSCULAR | Status: AC
  Filled 2022-09-28: qty 5

## 2022-09-28 MED ORDER — KETAMINE HCL 10 MG/ML IJ SOLN
INTRAMUSCULAR | Status: DC | PRN
  Administered 2022-09-28: 20 mg via INTRAVENOUS

## 2022-09-28 MED ORDER — DEXMEDETOMIDINE HCL IN NACL 80 MCG/20ML IV SOLN
INTRAVENOUS | Status: DC | PRN
  Administered 2022-09-28: 12 ug via BUCCAL

## 2022-09-28 MED ORDER — SODIUM CHLORIDE 0.9 % IV BOLUS (SEPSIS)
1000.0000 mL | Freq: Once | INTRAVENOUS | Status: AC
  Administered 2022-09-28: 1000 mL via INTRAVENOUS

## 2022-09-28 MED ORDER — MORPHINE SULFATE (PF) 4 MG/ML IV SOLN
4.0000 mg | Freq: Once | INTRAVENOUS | Status: AC
  Administered 2022-09-28: 4 mg via INTRAVENOUS
  Filled 2022-09-28: qty 1

## 2022-09-28 MED ORDER — METRONIDAZOLE 500 MG/100ML IV SOLN
500.0000 mg | Freq: Once | INTRAVENOUS | Status: AC
  Administered 2022-09-28: 500 mg via INTRAVENOUS
  Filled 2022-09-28 (×2): qty 100

## 2022-09-28 MED ORDER — BUPIVACAINE LIPOSOME 1.3 % IJ SUSP
INTRAMUSCULAR | Status: DC | PRN
  Administered 2022-09-28: 20 mL

## 2022-09-28 MED ORDER — DIPHENHYDRAMINE HCL 12.5 MG/5ML PO ELIX: 12.5000 mg | ORAL_SOLUTION | Freq: Four times a day (QID) | ORAL | Status: DC | PRN

## 2022-09-28 MED ORDER — BUPIVACAINE-EPINEPHRINE (PF) 0.25% -1:200000 IJ SOLN
INTRAMUSCULAR | Status: DC | PRN
  Administered 2022-09-28: 30 mL via PERINEURAL

## 2022-09-28 MED ORDER — LIDOCAINE HCL (CARDIAC) PF 100 MG/5ML IV SOSY
PREFILLED_SYRINGE | INTRAVENOUS | Status: DC | PRN
  Administered 2022-09-28: 100 mg via INTRAVENOUS

## 2022-09-28 MED ORDER — KETOROLAC TROMETHAMINE 15 MG/ML IJ SOLN
15.0000 mg | Freq: Four times a day (QID) | INTRAMUSCULAR | Status: AC
  Administered 2022-09-28: 15 mg via INTRAVENOUS
  Filled 2022-09-28: qty 1

## 2022-09-28 MED ORDER — DROPERIDOL 2.5 MG/ML IJ SOLN
0.6250 mg | Freq: Once | INTRAMUSCULAR | Status: AC | PRN
  Administered 2022-09-28: 0.625 mg via INTRAVENOUS

## 2022-09-28 MED ORDER — ONDANSETRON HCL 4 MG/2ML IJ SOLN
INTRAMUSCULAR | Status: DC | PRN
  Administered 2022-09-28: 4 mg via INTRAVENOUS

## 2022-09-28 MED ORDER — ACETAMINOPHEN 10 MG/ML IV SOLN: 1000.0000 mg | Freq: Once | INTRAVENOUS | Status: DC | PRN

## 2022-09-28 MED ORDER — SODIUM CHLORIDE 0.9 % IV SOLN: INTRAVENOUS | Status: DC

## 2022-09-28 MED ORDER — ROCURONIUM BROMIDE 10 MG/ML (PF) SYRINGE
PREFILLED_SYRINGE | INTRAVENOUS | Status: AC
  Filled 2022-09-28: qty 10

## 2022-09-28 MED ORDER — KETOROLAC TROMETHAMINE 30 MG/ML IJ SOLN
INTRAMUSCULAR | Status: AC
  Filled 2022-09-28: qty 1

## 2022-09-28 MED ORDER — SODIUM CHLORIDE 0.9 % IV SOLN: 2.0000 g | Freq: Once | INTRAVENOUS | Status: DC

## 2022-09-28 MED ORDER — ONDANSETRON 4 MG PO TBDP: 4.0000 mg | ORAL_TABLET | Freq: Four times a day (QID) | ORAL | Status: DC | PRN

## 2022-09-28 MED ORDER — SODIUM CHLORIDE 0.9 % IV SOLN: 2.0000 g | INTRAVENOUS | Status: DC

## 2022-09-28 MED ORDER — BUPIVACAINE HCL (PF) 0.25 % IJ SOLN
INTRAMUSCULAR | Status: AC
  Filled 2022-09-28: qty 30

## 2022-09-28 MED ORDER — ACETAMINOPHEN 10 MG/ML IV SOLN
INTRAVENOUS | Status: AC
  Filled 2022-09-28: qty 100

## 2022-09-28 MED ORDER — EPINEPHRINE PF 1 MG/ML IJ SOLN
INTRAMUSCULAR | Status: AC
  Filled 2022-09-28: qty 1

## 2022-09-28 MED ORDER — DIPHENHYDRAMINE HCL 50 MG/ML IJ SOLN: 12.5000 mg | Freq: Four times a day (QID) | INTRAMUSCULAR | Status: DC | PRN

## 2022-09-28 MED ORDER — SUGAMMADEX SODIUM 200 MG/2ML IV SOLN
INTRAVENOUS | Status: DC | PRN
  Administered 2022-09-28: 200 mg via INTRAVENOUS

## 2022-09-28 MED ORDER — HEPARIN SODIUM (PORCINE) 5000 UNIT/ML IJ SOLN
5000.0000 [IU] | Freq: Three times a day (TID) | INTRAMUSCULAR | Status: DC
  Filled 2022-09-28: qty 1

## 2022-09-28 MED ORDER — ACETAMINOPHEN 10 MG/ML IV SOLN
INTRAVENOUS | Status: DC | PRN
  Administered 2022-09-28: 1000 mg via INTRAVENOUS

## 2022-09-28 MED ORDER — HYDROCODONE-ACETAMINOPHEN 5-325 MG PO TABS
1.0000 | ORAL_TABLET | Freq: Four times a day (QID) | ORAL | 0 refills | Status: DC | PRN
Start: 2022-09-28 — End: 2022-10-14

## 2022-09-28 MED ORDER — MORPHINE SULFATE (PF) 2 MG/ML IV SOLN: 2.0000 mg | INTRAVENOUS | Status: DC | PRN

## 2022-09-28 MED ORDER — ONDANSETRON HCL 4 MG/2ML IJ SOLN
4.0000 mg | Freq: Once | INTRAMUSCULAR | Status: AC
  Administered 2022-09-28: 4 mg via INTRAVENOUS
  Filled 2022-09-28: qty 2

## 2022-09-28 MED ORDER — PANTOPRAZOLE SODIUM 40 MG IV SOLR
40.0000 mg | Freq: Every day | INTRAVENOUS | Status: DC
  Administered 2022-09-28: 40 mg via INTRAVENOUS
  Filled 2022-09-28: qty 10

## 2022-09-28 MED ORDER — INDOCYANINE GREEN 25 MG IV SOLR
2.5000 mg | Freq: Once | INTRAVENOUS | Status: AC
  Administered 2022-09-28: 2.5 mg via INTRAVENOUS
  Filled 2022-09-28: qty 1

## 2022-09-28 MED ORDER — PHENYLEPHRINE HCL (PRESSORS) 10 MG/ML IV SOLN
INTRAVENOUS | Status: DC | PRN
  Administered 2022-09-28: 160 ug via INTRAVENOUS

## 2022-09-28 MED ORDER — DEXAMETHASONE SODIUM PHOSPHATE 10 MG/ML IJ SOLN
INTRAMUSCULAR | Status: DC | PRN
  Administered 2022-09-28: 5 mg via INTRAVENOUS

## 2022-09-28 MED ORDER — FENTANYL CITRATE (PF) 100 MCG/2ML IJ SOLN
INTRAMUSCULAR | Status: AC
  Filled 2022-09-28: qty 2

## 2022-09-28 MED ORDER — FENTANYL CITRATE (PF) 100 MCG/2ML IJ SOLN: 25.0000 ug | INTRAMUSCULAR | Status: DC | PRN

## 2022-09-28 MED ORDER — PROMETHAZINE HCL 25 MG/ML IJ SOLN
INTRAMUSCULAR | Status: AC
  Filled 2022-09-28: qty 1

## 2022-09-28 MED ORDER — METRONIDAZOLE 500 MG/100ML IV SOLN: 500.0000 mg | Freq: Once | INTRAVENOUS | Status: DC

## 2022-09-28 MED ORDER — PHENYLEPHRINE 80 MCG/ML (10ML) SYRINGE FOR IV PUSH (FOR BLOOD PRESSURE SUPPORT)
PREFILLED_SYRINGE | INTRAVENOUS | Status: AC
  Filled 2022-09-28: qty 10

## 2022-09-28 MED ORDER — MIDAZOLAM HCL 2 MG/2ML IJ SOLN
INTRAMUSCULAR | Status: AC
  Filled 2022-09-28: qty 2

## 2022-09-28 MED ORDER — HYDRALAZINE HCL 20 MG/ML IJ SOLN: 10.0000 mg | INTRAMUSCULAR | Status: DC | PRN

## 2022-09-28 MED ORDER — KETOROLAC TROMETHAMINE 15 MG/ML IJ SOLN
15.0000 mg | Freq: Four times a day (QID) | INTRAMUSCULAR | Status: DC | PRN
  Administered 2022-09-28: 15 mg via INTRAVENOUS
  Filled 2022-09-28: qty 1

## 2022-09-28 MED ORDER — OXYCODONE HCL 5 MG PO TABS: 5.0000 mg | ORAL_TABLET | Freq: Once | ORAL | Status: DC | PRN

## 2022-09-28 MED ORDER — 0.9 % SODIUM CHLORIDE (POUR BTL) OPTIME
TOPICAL | Status: DC | PRN
  Administered 2022-09-28: 500 mL

## 2022-09-28 MED ORDER — FENTANYL CITRATE (PF) 100 MCG/2ML IJ SOLN
INTRAMUSCULAR | Status: DC | PRN
  Administered 2022-09-28: 50 ug via INTRAVENOUS
  Administered 2022-09-28: 25 ug via INTRAVENOUS

## 2022-09-28 MED ORDER — PROCHLORPERAZINE EDISYLATE 10 MG/2ML IJ SOLN: 5.0000 mg | Freq: Four times a day (QID) | INTRAMUSCULAR | Status: DC | PRN

## 2022-09-28 MED ORDER — ONDANSETRON HCL 4 MG/2ML IJ SOLN
4.0000 mg | Freq: Four times a day (QID) | INTRAMUSCULAR | Status: DC | PRN
  Administered 2022-09-28: 4 mg via INTRAVENOUS
  Filled 2022-09-28: qty 2

## 2022-09-28 MED ORDER — LACTATED RINGERS IV SOLN: INTRAVENOUS | Status: DC | PRN

## 2022-09-28 MED ORDER — PROMETHAZINE HCL 25 MG/ML IJ SOLN
6.2500 mg | INTRAMUSCULAR | Status: DC | PRN
  Administered 2022-09-28: 6.25 mg via INTRAVENOUS

## 2022-09-28 MED ORDER — PROPOFOL 10 MG/ML IV BOLUS
INTRAVENOUS | Status: DC | PRN
  Administered 2022-09-28: 50 mg via INTRAVENOUS
  Administered 2022-09-28: 120 mg via INTRAVENOUS

## 2022-09-28 MED ORDER — OXYCODONE HCL 5 MG/5ML PO SOLN: 5.0000 mg | Freq: Once | ORAL | Status: DC | PRN

## 2022-09-28 SURGICAL SUPPLY — 43 items
CANNULA REDUCER 12-8 DVNC XI (CANNULA) ×1 IMPLANT
CAUTERY HOOK MNPLR 1.6 DVNC XI (INSTRUMENTS) ×1 IMPLANT
CLIP LIGATING HEMO O LOK GREEN (MISCELLANEOUS) ×1 IMPLANT
DERMABOND ADVANCED .7 DNX12 (GAUZE/BANDAGES/DRESSINGS) ×1 IMPLANT
DERMABOND ADVANCED .7 DNX6 (GAUZE/BANDAGES/DRESSINGS) IMPLANT
DRAPE ARM DVNC X/XI (DISPOSABLE) ×4 IMPLANT
DRAPE COLUMN DVNC XI (DISPOSABLE) ×1 IMPLANT
ELECT CAUTERY BLADE 6.4 (BLADE) ×1 IMPLANT
ELECT REM PT RETURN 9FT ADLT (ELECTROSURGICAL) ×1
ELECTRODE REM PT RTRN 9FT ADLT (ELECTROSURGICAL) ×1 IMPLANT
FORCEPS BPLR R/ABLATION 8 DVNC (INSTRUMENTS) ×1 IMPLANT
FORCEPS PROGRASP DVNC XI (FORCEP) ×1 IMPLANT
GLOVE BIO SURGEON STRL SZ7 (GLOVE) ×2 IMPLANT
GOWN STRL REUS W/ TWL LRG LVL3 (GOWN DISPOSABLE) ×4 IMPLANT
GOWN STRL REUS W/TWL LRG LVL3 (GOWN DISPOSABLE) ×4
IRRIGATION STRYKERFLOW (MISCELLANEOUS) IMPLANT
IRRIGATOR STRYKERFLOW (MISCELLANEOUS)
KIT PINK PAD W/HEAD ARE REST (MISCELLANEOUS) ×1 IMPLANT
KIT PINK PAD W/HEAD ARM REST (MISCELLANEOUS) ×1 IMPLANT
LABEL OR SOLS (LABEL) ×1 IMPLANT
MANIFOLD NEPTUNE II (INSTRUMENTS) ×1 IMPLANT
NDL HYPO 22X1.5 SAFETY MO (MISCELLANEOUS) ×1 IMPLANT
NEEDLE HYPO 22X1.5 SAFETY MO (MISCELLANEOUS) ×1 IMPLANT
NS IRRIG 500ML POUR BTL (IV SOLUTION) ×1 IMPLANT
OBTURATOR OPTICAL STND 8 DVNC (TROCAR) ×1
OBTURATOR OPTICALSTD 8 DVNC (TROCAR) ×1 IMPLANT
PACK LAP CHOLECYSTECTOMY (MISCELLANEOUS) ×1 IMPLANT
PENCIL SMOKE EVACUATOR (MISCELLANEOUS) ×1 IMPLANT
SEAL UNIV 5-12 XI (MISCELLANEOUS) ×4 IMPLANT
SET TUBE SMOKE EVAC HIGH FLOW (TUBING) ×1 IMPLANT
SOL ELECTROSURG ANTI STICK (MISCELLANEOUS) ×1
SOLUTION ELECTROSURG ANTI STCK (MISCELLANEOUS) ×1 IMPLANT
SPIKE FLUID TRANSFER (MISCELLANEOUS) ×1 IMPLANT
SPONGE T-LAP 18X18 ~~LOC~~+RFID (SPONGE) ×1 IMPLANT
SPONGE T-LAP 4X18 ~~LOC~~+RFID (SPONGE) IMPLANT
SUT MNCRL AB 4-0 PS2 18 (SUTURE) ×1 IMPLANT
SUT VICRYL 0 UR6 27IN ABS (SUTURE) ×2 IMPLANT
SYR 20ML LL LF (SYRINGE) IMPLANT
SYS BAG RETRIEVAL 10MM (BASKET) ×1
SYSTEM BAG RETRIEVAL 10MM (BASKET) ×1 IMPLANT
TRAP FLUID SMOKE EVACUATOR (MISCELLANEOUS) ×1 IMPLANT
WATER STERILE IRR 3000ML UROMA (IV SOLUTION) IMPLANT
WATER STERILE IRR 500ML POUR (IV SOLUTION) ×1 IMPLANT

## 2022-09-28 NOTE — Op Note (Signed)
Robotic assisted laparoscopic Cholecystectomy  Pre-operative Diagnosis: Acute cholecystitis  Post-operative Diagnosis: same  Procedure:  Robotic assisted laparoscopic Cholecystectomy  Surgeon: Sterling Big, MD FACS  Anesthesia: Gen. with endotracheal tube  Findings: Acute Cholecystitis   Estimated Blood Loss:5 cc       Specimens: Gallbladder           Complications: none   Procedure Details  The patient was seen again in the Holding Room. The benefits, complications, treatment options, and expected outcomes were discussed with the patient. The risks of bleeding, infection, recurrence of symptoms, failure to resolve symptoms, bile duct damage, bile duct leak, retained common bile duct stone, bowel injury, any of which could require further surgery and/or ERCP, stent, or papillotomy were reviewed with the patient. The likelihood of improving the patient's symptoms with return to their baseline status is good.  The patient and/or family concurred with the proposed plan, giving informed consent.  The patient was taken to Operating Room, identified  and the procedure verified as Laparoscopic Cholecystectomy.  A Time Out was held and the above information confirmed.  Prior to the induction of general anesthesia, antibiotic prophylaxis was administered. VTE prophylaxis was in place. General endotracheal anesthesia was then administered and tolerated well. After the induction, the abdomen was prepped with Chloraprep and draped in the sterile fashion. The patient was positioned in the supine position.  Cut down technique was used to enter the abdominal cavity and a Hasson trochar was placed after two vicryl stitches were anchored to the fascia. Pneumoperitoneum was then created with CO2 and tolerated well without any adverse changes in the patient's vital signs.  Three 8-mm ports were placed under direct vision. All skin incisions  were infiltrated with a local anesthetic agent before making the  incision and placing the trocars.   The patient was positioned  in reverse Trendelenburg, robot was brought to the surgical field and docked in the standard fashion.  We made sure all the instrumentation was kept indirect view at all times and that there were no collision between the arms. I scrubbed out and went to the console.  The gallbladder was identified, the fundus grasped and retracted cephalad. Adhesions were lysed bluntly. The infundibulum was grasped and retracted laterally, exposing the peritoneum overlying the triangle of Calot. This was then divided and exposed in a blunt fashion. An extended critical view of the cystic duct and cystic artery was obtained.  The cystic duct was clearly identified and bluntly dissected.   Artery and duct were double clipped and divided. Using ICG cholangiography we visualize the cystic duct and CBD w/o evidence of bile injuries. The gallbladder was taken from the gallbladder fossa in a retrograde fashion with the electrocautery.  Hemostasis was achieved with the electrocautery. nspection of the right upper quadrant was performed. No bleeding, bile duct injury or leak, or bowel injury was noted. Robotic instruments and robotic arms were undocked in the standard fashion.  I scrubbed back in.  The gallbladder was removed and placed in an Endocatch bag.   Pneumoperitoneum was released.  The periumbilical port site was closed with interrumpted 0 Vicryl sutures. 4-0 subcuticular Monocryl was used to close the skin. Dermabond was  applied.  The patient was then extubated and brought to the recovery room in stable condition. Sponge, lap, and needle counts were correct at closure and at the conclusion of the case.               Sterling Big, MD, FACS

## 2022-09-28 NOTE — Anesthesia Postprocedure Evaluation (Signed)
Anesthesia Post Note  Patient: Ashley Ward  Procedure(s) Performed: XI ROBOTIC ASSISTED LAPAROSCOPIC CHOLECYSTECTOMY (Abdomen)  Patient location during evaluation: PACU Anesthesia Type: General Level of consciousness: awake and alert Pain management: pain level controlled Vital Signs Assessment: post-procedure vital signs reviewed and stable Respiratory status: spontaneous breathing, nonlabored ventilation and respiratory function stable Cardiovascular status: blood pressure returned to baseline and stable Postop Assessment: no apparent nausea or vomiting Anesthetic complications: no   No notable events documented.   Last Vitals:  Vitals:   09/28/22 1415 09/28/22 1452  BP: (!) 152/78 (!) 147/71  Pulse: 84 80  Resp: 16 18  Temp: 36.9 C 36.7 C  SpO2: 99% 98%    Last Pain:  Vitals:   09/28/22 1452  TempSrc: Oral  PainSc: Asleep                 Foye Deer

## 2022-09-28 NOTE — ED Provider Notes (Signed)
[facility] Provider Note    Event Date/Time   First MD Initiated Contact with Patient 09/28/22 0010     (approximate)   History   Abdominal Pain   HPI  Ashley Ward is a 72 y.o. female with history of hyperlipidemia who presents to the emergency department with her husband with complaints of upper abdominal pain that started this afternoon but worsened tonight.  Is having nausea without vomiting.  No diarrhea.  No dysuria, hematuria, vaginal bleeding or discharge.  No chest pain or shortness of breath.  Has never had similar symptoms.  Denies previous abdominal surgeries.  States she ate baked chicken, cabbage and pen toast for lunch.  Was only able to eat a piece of bread for dinner.  No previous history of biliary colic, pancreatitis.  Does not drink alcohol.  States she did take Tums earlier today which seemed to help with some of her pain.   History provided by patient, husband.    Past Medical History:  Diagnosis Date   Hyperlipidemia    Menopause    Menopause    Radial head fracture, closed 11/09/2019    Past Surgical History:  Procedure Laterality Date   DILATION AND CURETTAGE, DIAGNOSTIC / THERAPEUTIC  2007   Kincius,  for spotting, fresolved    MEDICATIONS:  Prior to Admission medications   Medication Sig Start Date End Date Taking? Authorizing Provider  buPROPion (WELLBUTRIN XL) 150 MG 24 hr tablet TAKE ONE TABLET EVERY DAY ALONG WITH 300MG  FOR DAILY DOSE OF 450MG  08/11/22   Sherlene Shams, MD  buPROPion (WELLBUTRIN XL) 300 MG 24 hr tablet TAKE ONE TABLET EVERY DAY ALONG WITH 150MG  FOR DAILY DOSE OF 450MG  . NEED APPT. 09/25/22   Sherlene Shams, MD  cloNIDine (CATAPRES) 0.2 MG tablet Take 1 tablet (0.2 mg total) by mouth daily. 12/24/21   Sherlene Shams, MD  ergocalciferol (VITAMIN D2) 1.25 MG (50000 UT) capsule Take 1 capsule (50,000 Units total) by mouth once a week. 05/03/21   Sherlene Shams, MD  Multiple Vitamins-Minerals  (PRESERVISION AREDS PO) Take 2 capsules by mouth daily.    [provider]  nitrofurantoin, macrocrystal-monohydrate, (MACROBID) 100 MG capsule Take 1 capsule (100 mg total) by mouth 2 (two) times daily. 08/27/22   Bethanie Dicker, NP    Physical Exam   Triage Vital Signs: ED Triage Vitals  Enc Vitals Group     BP 09/27/22 2349 (!) 146/80     Pulse Rate 09/27/22 2349 83     Resp 09/27/22 2349 18     Temp 09/27/22 2349 97.7 F (36.5 C)     Temp Source 09/27/22 2349 Oral     SpO2 09/27/22 2349 100 %     Weight 09/27/22 2350 135 lb (61.2 kg)     Height 09/27/22 2350 5\' 5"  (1.651 m)     Head Circumference --      Peak Flow --      Pain Score 09/27/22 2350 10     Pain Loc --      Pain Edu? --      Excl. in GC? --     Most recent vital signs: Vitals:   09/28/22 0039 09/28/22 0100  BP: (!) 152/78 137/72  Pulse: 94 85  Resp: 18 18  Temp:    SpO2: 100% 93%    CONSTITUTIONAL: Alert, responds appropriately to questions.  Elderly, appears uncomfortable HEAD: Normocephalic, atraumatic EYES: Conjunctivae clear, pupils appear equal, sclera  nonicteric ENT: normal nose; moist mucous membranes NECK: Supple, normal ROM CARD: RRR; S1 and S2 appreciated RESP: Normal chest excursion without splinting or tachypnea; breath sounds clear and equal bilaterally; no wheezes, no rhonchi, no rales, no hypoxia or respiratory distress, speaking full sentences ABD/GI: Non-distended; soft, tender throughout the upper abdomen, positive Murphy sign, no tenderness at McBurney's point BACK: The back appears normal EXT: Normal ROM in all joints; no deformity noted, no edema SKIN: Normal color for age and race; warm; no rash on exposed skin NEURO: Moves all extremities equally, normal speech PSYCH: The patient's mood and manner are appropriate.   ED Results / Procedures / Treatments   LABS: (all labs ordered are listed, but only abnormal results are displayed) Labs Reviewed  COMPREHENSIVE  METABOLIC PANEL - Abnormal; Notable for the following components:      Result Value   Glucose, Bld 134 (*)    BUN 25 (*)    AST 128 (*)    ALT 57 (*)    Total Bilirubin 1.4 (*)    All other components within normal limits  CBC WITH DIFFERENTIAL/PLATELET  LIPASE, BLOOD  LACTIC ACID, PLASMA  URINALYSIS, W/ REFLEX TO CULTURE (INFECTION SUSPECTED)  TROPONIN I (HIGH SENSITIVITY)     EKG:  EKG Interpretation  Date/Time:  Saturday September 27 2022 23:58:38 EDT Ventricular Rate:  78 PR Interval:  142 QRS Duration: 84 QT Interval:  358 QTC Calculation: 408 R Axis:   77 Text Interpretation: Normal sinus rhythm Nonspecific ST abnormality Abnormal ECG No previous ECGs available Confirmed by Rochele Raring [phone]) on 09/28/2022 12:14:12 AM         RADIOLOGY: My personal review and interpretation of imaging: Acute cholecystitis.  I have personally reviewed all radiology reports.   US ABDOMEN LIMITED RUQ (LIVER/GB)  Result Date: 09/28/2022 CLINICAL DATA:  Right upper quadrant pain EXAM: ULTRASOUND ABDOMEN LIMITED RIGHT UPPER QUADRANT COMPARISON:  None Available. FINDINGS: Gallbladder: Cholelithiasis. Distended gallbladder measuring 11.8 x 4.1 x 4.2 cm. Mild wall thickening measuring 4 mm pericholecystic fluid. Positive sonographic Murphy's sign noted by the sonographer. Polyps in gallbladder wall measuring up 4 mm. Additional calcifications with ring down artifact measuring up to 4 mm. These are likely due to adenomyomatosis. No follow-up is recommended. Common bile duct: Diameter: 7 mm.  No intrahepatic biliary dilation. Liver: No focal lesion identified. Within normal limits in parenchymal echogenicity. Portal vein is patent on color Doppler imaging with normal direction of blood flow towards the liver. Other: None. IMPRESSION: 1. Findings concerning for acute cholecystitis. Electronically Signed   By: Minerva Fester M.D.   On: 09/28/2022 01:14     PROCEDURES:  Critical Care performed:  No      .1-3 Lead EKG Interpretation  Performed by: Yehonatan Grandison, Layla Maw, DO Authorized by: Roosevelt Eimers, Layla Maw, DO     Interpretation: normal     ECG rate:  94   ECG rate assessment: normal     Rhythm: sinus rhythm     Ectopy: none     Conduction: normal       IMPRESSION / MDM / ASSESSMENT AND PLAN / ED COURSE  I reviewed the triage vital signs and the nursing notes.    Patient here with upper abdominal pain, nausea.  The patient is on the cardiac monitor to evaluate for evidence of arrhythmia and/or significant heart rate changes.   DIFFERENTIAL DIAGNOSIS (includes but not limited to):   Biliary colic, cholecystitis, cholangitis, choledocholithiasis, pancreatitis, gastritis, GERD, ACS, less likely appendicitis  Patient's presentation is most consistent with acute presentation with potential threat to life or bodily function.   PLAN: EKG nonischemic.  Will obtain abdominal labs along with troponin.  Will obtain right upper quadrant ultrasound initially but if this is unremarkable, will obtain CT of the abdomen pelvis.  Will give IV fluids, pain and nausea medicine.   MEDICATIONS GIVEN IN ED: Medications  cefTRIAXone (ROCEPHIN) 2 g in sodium chloride 0.9 % 100 mL IVPB (has no administration in time range)  metroNIDAZOLE (FLAGYL) IVPB 500 mg (has no administration in time range)  sodium chloride 0.9 % bolus 1,000 mL (0 mLs Intravenous Stopped 09/28/22 0106)  ondansetron (ZOFRAN) injection 4 mg (4 mg Intravenous Given 09/28/22 0035)  morphine (PF) 4 MG/ML injection 4 mg (4 mg Intravenous Given 09/28/22 0035)     ED COURSE: Patient has no leukocytosis.  AST, ALT and total bilirubin minimally elevated.  Normal lipase.  Ultrasound concerning for wall thickening, pericholecystic fluid, gallbladder polyps.  Common bile duct is normal in size.  No intrahepatic biliary dilatation.  Pain improved after morphine.  Discussed with Dr. Cathey Endow with general surgery who will admit for  cholecystectomy later today.  Will keep n.p.o. patient patient is not on any antiplatelets or anticoagulants.   CONSULTS: Consult to Dr. Cathey Endow with general surgery for admission for cholecystectomy.   OUTSIDE RECORDS REVIEWED:  Reviewed recent office visit with family medicine on 08/27/2022.       FINAL CLINICAL IMPRESSION(S) / ED DIAGNOSES   Final diagnoses:  Upper abdominal pain  Acute cholecystitis     Rx / DC Orders   ED Discharge Orders     None        Note:  This document was prepared using Dragon voice recognition software and may include unintentional dictation errors.   Niyah Mamaril, Layla Maw, DO 09/28/22 [phone]

## 2022-09-28 NOTE — Anesthesia Procedure Notes (Signed)
Procedure Name: Intubation Date/Time: 09/28/2022 12:28 PM  Performed by: Lynden Oxford, CRNAPre-anesthesia Checklist: Patient identified, Emergency Drugs available, Suction available and Patient being monitored Patient Re-evaluated:Patient Re-evaluated prior to induction Oxygen Delivery Method: Circle system utilized Preoxygenation: Pre-oxygenation with 100% oxygen Induction Type: IV induction Ventilation: Mask ventilation without difficulty Laryngoscope Size: McGraph and 3 Grade View: Grade II Tube type: Oral Number of attempts: 1 Airway Equipment and Method: Stylet and Video-laryngoscopy Placement Confirmation: ETT inserted through vocal cords under direct vision, positive ETCO2 and breath sounds checked- equal and bilateral Secured at: 18 cm Tube secured with: Tape Dental Injury: Teeth and Oropharynx as per pre-operative assessment

## 2022-09-28 NOTE — ED Notes (Signed)
PT brought to ed rm 1 at this time, this RN now assuming care.

## 2022-09-28 NOTE — ED Notes (Signed)
Pt is in obvious pain in tears in triage and unable to sit in wheelchair and writhing in pain. Pt placed in recliner.

## 2022-09-28 NOTE — Discharge Instructions (Addendum)
Laparoscopic Cholecystectomy, Care After   These instructions give you information on caring for yourself after your procedure. Your doctor may also give you more specific instructions. Call your doctor if you have any problems or questions after your procedure.  HOME CARE  Change your bandages (dressings) as told by your doctor.  Keep the wound dry and clean. Wash the wound gently with soap and water. Pat the wound dry with a clean towel.  Do not take baths, swim, or use hot tubs for 2 weeks, or as told by your doctor.  Only take medicine as told by your doctor.  Eat a normal diet as told by your doctor.  Do not lift anything heavier than 10 pounds (4.5 kg) until your doctor says it is okay.  Do not play contact sports for 1 week, or as told by your doctor. GET HELP IF:  Your wound is red, puffy (swollen), or painful.  You have yellowish-white fluid (pus) coming from the wound.  You have fluid draining from the wound for more than 1 day.  You have a bad smell coming from the wound.  Your wound breaks open. GET HELP RIGHT AWAY IF:  You have trouble breathing.  You have chest pain.  You have a fever >101  You have pain in the shoulders (shoulder strap areas) that is getting worse.  You feel dizzy or pass out (faint).  You have severe belly (abdominal) pain.  You feel sick to your stomach (nauseous) or throw up (vomit) for more than 1 day.   AMBULATORY SURGERY  DISCHARGE INSTRUCTIONS   The drugs that you were given will stay in your system until tomorrow so for the next 24 hours you should not:  Drive an automobile Make any legal decisions Drink any alcoholic beverage   You may resume regular meals tomorrow.  Today it is better to start with liquids and gradually work up to solid foods.  You may eat anything you prefer, but it is better to start with liquids, then soup and crackers, and gradually work up to solid foods.   Please notify your doctor immediately if you have any  unusual bleeding, trouble breathing, redness and pain at the surgery site, drainage, fever, or pain not relieved by medication.    Your post-operative visit will need to be made. Call the office Monday morning to schedule that.                 Please call to schedule your post-operative visit. PLEASE LEAVE GREEN/TEAL BRACELET ON FOR 4 DAYS  Additional Instructions:

## 2022-09-28 NOTE — Anesthesia Preprocedure Evaluation (Addendum)
Anesthesia Evaluation  Patient identified by MRN, date of birth, ID band Patient awake    Reviewed: Allergy & Precautions, H&P , NPO status , Patient's Chart, lab work & pertinent test results  Airway Mallampati: III  TM Distance: >3 FB Neck ROM: full    Dental no notable dental hx.    Pulmonary neg pulmonary ROS   Pulmonary exam normal        Cardiovascular negative cardio ROS Normal cardiovascular exam     Neuro/Psych  PSYCHIATRIC DISORDERS  Depression    negative neurological ROS     GI/Hepatic Neg liver ROS,,,cholecystitis   Endo/Other  negative endocrine ROS    Renal/GU      Musculoskeletal   Abdominal Normal abdominal exam  (+)   Peds  Hematology negative hematology ROS (+)   Anesthesia Other Findings Past Medical History: No date: Hyperlipidemia No date: Menopause No date: Menopause 11/09/2019: Radial head fracture, closed  Past Surgical History: 2007: DILATION AND CURETTAGE, DIAGNOSTIC / THERAPEUTIC     Comment:  Kincius,  for spotting, fresolved  BMI    Body Mass Index: 22.47 kg/m      Reproductive/Obstetrics negative OB ROS                             Anesthesia Physical Anesthesia Plan  ASA: 2  Anesthesia Plan: General ETT   Post-op Pain Management: Ofirmev IV (intra-op)* and Precedex   Induction: Intravenous  PONV Risk Score and Plan: 3 and Ondansetron, Dexamethasone, Midazolam and Droperidol  Airway Management Planned: Oral ETT  Additional Equipment:   Intra-op Plan:   Post-operative Plan: Extubation in OR  Informed Consent: I have reviewed the patients History and Physical, chart, labs and discussed the procedure including the risks, benefits and alternatives for the proposed anesthesia with the patient or authorized representative who has indicated his/her understanding and acceptance.     Dental Advisory Given  Plan Discussed with: CRNA and  Surgeon  Anesthesia Plan Comments:         Anesthesia Quick Evaluation

## 2022-09-28 NOTE — H&P (Signed)
Patient ID: Ashley Ward, female   DOB: Oct 16, 1950, 72 y.o.   MRN: [id number]  HPI Ashley Ward is a 72 y.o. female seen in consultation at the request of Dr. Elesa Massed.  She came in last night to the emergency room complaining of right upper quadrant and epigastric pain.  The pain was severe intermittent and sharp.  She has associated hyporexia.  No vomiting, no fevers no chills no jaundice.  She did have some nausea.  She did have a prior episode several months ago that was relieved by Tums.  She did take Tums this time but did not rliefl the pain. She is very active and healthy and she is able to perform more than 4 METS of activity without any shortness of breath or chest pain.  She did have an ultrasound that have personally reviewed showing evidence of distended gallbladder with stones potential gallbladder polyps.  Common bile duct is 7 mm w/o biliary dilation or evidence of CBD stones. CBC abd cmp nml except mild ast elevation. She does have an upcoming trip to [place] world later this week.  We discussed about potential post op limitations.  HPI  Past Medical History:  Diagnosis Date   Hyperlipidemia    Menopause    Menopause    Radial head fracture, closed 11/09/2019    Past Surgical History:  Procedure Laterality Date   DILATION AND CURETTAGE, DIAGNOSTIC / THERAPEUTIC  2007   [place],  for spotting, fresolved    Family History  Problem Relation Age of Onset   Diabetes Mother    Cancer Mother        lymphoma., in remission   COPD Father    Cancer Sister        half sister , BRCA, negative genetic testing   Alcohol abuse Sister    Breast cancer Sister 5   Cancer Maternal Grandfather        prostate   Hyperlipidemia Cousin    Breast cancer Cousin     Social History Social History   Tobacco Use   Smoking status: Never   Smokeless tobacco: Never  Vaping Use   Vaping Use: Never used  Substance Use Topics   Alcohol use: Yes    Alcohol/week: 4.0 standard drinks of  alcohol    Types: 4 Glasses of wine per week   Drug use: No    Allergies  Allergen Reactions   Penicillins     Current Facility-Administered Medications  Medication Dose Route Frequency Provider Last Rate Last Admin   0.9 %  sodium chloride infusion   Intravenous Continuous Barbarajean Kinzler F, MD 100 mL/hr at 09/28/22 0230 New Bag at 09/28/22 0230   cefTRIAXone (ROCEPHIN) 2 g in sodium chloride 0.9 % 100 mL IVPB  2 g Intravenous Q24H Tram Wrenn F, MD       diphenhydrAMINE (BENADRYL) 12.5 MG/5ML elixir 12.5 mg  12.5 mg Oral Q6H PRN Meagen Limones F, MD       Or   diphenhydrAMINE (BENADRYL) injection 12.5 mg  12.5 mg Intravenous Q6H PRN Arron Mcnaught F, MD       heparin injection 5,000 Units  5,000 Units Subcutaneous Q8H Jla Reynolds F, MD       hydrALAZINE (APRESOLINE) injection 10 mg  10 mg Intravenous Q2H PRN Brinkley Peet F, MD       indocyanine green (IC-GREEN) injection 2.5 mg  2.5 mg Intravenous Once Allysha Tryon F, MD       ketorolac (TORADOL) 15 MG/ML  injection 15 mg  15 mg Intravenous Q6H PRN Babak Lucus F, MD       metroNIDAZOLE (FLAGYL) IVPB 500 mg  500 mg Intravenous Once Llewyn Heap F, MD       morphine (PF) 2 MG/ML injection 2 mg  2 mg Intravenous Q3H PRN Anice Wilshire F, MD       ondansetron (ZOFRAN-ODT) disintegrating tablet 4 mg  4 mg Oral Q6H PRN Koven Belinsky F, MD       Or   ondansetron (ZOFRAN) injection 4 mg  4 mg Intravenous Q6H PRN Dakiya Puopolo F, MD       pantoprazole (PROTONIX) injection 40 mg  40 mg Intravenous QHS Karri Kallenbach, Cecelia Byars F, MD   40 mg at 09/28/22 0331   prochlorperazine (COMPAZINE) tablet 10 mg  10 mg Oral Q6H PRN Elese Rane, Merri Ray, MD       Or   prochlorperazine (COMPAZINE) injection 5-10 mg  5-10 mg Intravenous Q6H PRN Kaetlyn Noa, Merri Ray, MD         Review of Systems Full ROS  was asked and was negative except for the information on the HPI  Physical Exam Blood pressure 127/68, pulse 80, temperature 97.8 F (36.6 C), resp. rate 12, height 5\' 5"  (1.651 m),  weight 61.2 kg, SpO2 98 %. CONSTITUTIONAL: NAD. EYES: Pupils are equal, round, t, Sclera are non-icteric. EARS, NOSE, MOUTH AND THROAT: T. The oral mucosa is pink and moist. Hearing is intact to voice. LYMPH NODES:  Lymph nodes in the neck are normal. RESPIRATORY:  Lungs are clear. There is normal respiratory effort, with equal breath sounds bilaterally, and without pathologic use of accessory muscles. CARDIOVASCULAR: Heart is regular without murmurs, gallops, or rubs. GI: The abdomen is  soft, very mild tenderness and discomfort on deep palpation without Murphy or peritonitis, and nondistended. There are no palpable masses. There is no hepatosplenomegaly.  GU: Rectal deferred.   MUSCULOSKELETAL: Normal muscle strength and tone. No cyanosis or edema.   SKIN: Turgor is good and there are no pathologic skin lesions or ulcers. NEUROLOGIC: Motor and sensation is grossly normal. Cranial nerves are grossly intact. PSYCH:  Oriented to person, place and time. Affect is normal.  Data Reviewed  I have personally reviewed the patient's imaging, laboratory findings and medical records.    Assessment/Plan 72 year old female with classic signs and symptoms consistent with acute cholecystitis supported by imaging studies.  Discussed with the patient and family in detail about her disease process.  We have already started IV antibiotics fluids and pain medications.  I do recommend prompt cholecystectomy they agree with the plan..The risks, benefits, complications, treatment options, and expected outcomes were discussed with the patient. The possibilities of bleeding, recurrent infection, finding a normal gallbladder, perforation of viscus organs, damage to surrounding structures, bile leak, abscess formation, needing a drain placed, the need for additional procedures, reaction to medication, pulmonary aspiration,  failure to diagnose a condition, the possible need to convert to an open procedure, and creating a  complication requiring transfusion or operation were discussed with the patient. The patient and/or family concurred with the proposed plan, giving informed consent.     Sterling Big, MD FACS General Surgeon 09/28/2022, 9:44 AM

## 2022-09-28 NOTE — ED Notes (Signed)
US at bedside

## 2022-09-28 NOTE — ED Notes (Signed)
ED Provider at bedside. 

## 2022-09-28 NOTE — Transfer of Care (Signed)
Immediate Anesthesia Transfer of Care Note  Patient: Ashley Ward  Procedure(s) Performed: XI ROBOTIC ASSISTED LAPAROSCOPIC CHOLECYSTECTOMY (Abdomen)  Patient Location: PACU  Anesthesia Type:General  Level of Consciousness: drowsy and patient cooperative  Airway & Oxygen Therapy: Patient Spontanous Breathing and Patient connected to face mask oxygen  Post-op Assessment: Report given to RN and Post -op Vital signs reviewed and stable  Post vital signs: Reviewed and stable  Last Vitals:  Vitals Value Taken Time  BP 127/91 09/28/22 1336  Temp    Pulse 86 09/28/22 1339  Resp 23 09/28/22 1339  SpO2 100 % 09/28/22 1339  Vitals shown include unvalidated device data.  Last Pain:  Vitals:   09/28/22 0736  TempSrc:   PainSc: 0-No pain      Patients Stated Pain Goal: 0 (09/28/22 0736)  Complications: No notable events documented.

## 2022-09-29 NOTE — Discharge Summary (Signed)
Patient ID: Ashley Ward MRN: [id number] DOB/AGE: 1950/08/25 72 y.o.  Admit date: 09/28/2022 Discharge date: 09/28/2022   Discharge Diagnoses:  Principal Problem:   Acute cholecystitis   Procedures:robotic cholecystectomy  Hospital Course: 72 yo admitted with findings consistent with acute cholecystitis and  was taken promptly to the operating room for an uneventful laparoscopic cholecystectomy.  Patient did well postop, at  The time of discharge the patient was ambulating,  pain was controlled.  Her vital signs were stable and she was afebrile.   physical exam at discharge showed a pt  in no acute distress.  Awake and alert.  Abdomen: Soft incisions healing well without infection or peritonitis.  Extremities well-perfused and no edema.  Condition of the patient the time of discharge was stable   Disposition: Discharge disposition: 01-Home or Self Care       Discharge Instructions     Call MD for:  difficulty breathing, headache or visual disturbances   Complete by: As directed    Call MD for:  extreme fatigue   Complete by: As directed    Call MD for:  hives   Complete by: As directed    Call MD for:  persistant dizziness or light-headedness   Complete by: As directed    Call MD for:  persistant nausea and vomiting   Complete by: As directed    Call MD for:  redness, tenderness, or signs of infection (pain, swelling, redness, odor or green/yellow discharge around incision site)   Complete by: As directed    Call MD for:  severe uncontrolled pain   Complete by: As directed    Call MD for:  temperature >100.4   Complete by: As directed    Diet - low sodium heart healthy   Complete by: As directed    Discharge instructions   Complete by: As directed    Shower 48 hrs   Increase activity slowly   Complete by: As directed    Lifting restrictions   Complete by: As directed    20 lbs x 6 wks   No wound care   Complete by: As directed       Allergies as of  09/28/2022       Reactions   Penicillins         Medication List     TAKE these medications    buPROPion 150 MG 24 hr tablet Commonly known as: WELLBUTRIN XL TAKE ONE TABLET EVERY DAY ALONG WITH 300MG  FOR DAILY DOSE OF 450MG    buPROPion 300 MG 24 hr tablet Commonly known as: WELLBUTRIN XL TAKE ONE TABLET EVERY DAY ALONG WITH 150MG  FOR DAILY DOSE OF 450MG  . NEED APPT.   cloNIDine 0.2 MG tablet Commonly known as: Catapres Take 1 tablet (0.2 mg total) by mouth daily.   ergocalciferol 1.25 MG (50000 UT) capsule Commonly known as: VITAMIN D2 Take 1 capsule (50,000 Units total) by mouth once a week.   HYDROcodone-acetaminophen 5-325 MG tablet Commonly known as: NORCO/VICODIN Take 1-2 tablets by mouth every 6 (six) hours as needed for moderate pain.   nitrofurantoin (macrocrystal-monohydrate) 100 MG capsule Commonly known as: Macrobid Take 1 capsule (100 mg total) by mouth 2 (two) times daily.   PRESERVISION AREDS PO Take 2 capsules by mouth daily.        Follow-up Information     Stockton Nunley, [place] F, MD Follow up in 2 week(s).   Specialty: General Surgery Why: Call monday morning to make a follow up appointment for 2  weeks after your surgery date. Contact information: [address] [place] [address] [phone]                  Sterling Big, MD FACS

## 2022-09-30 LAB — SURGICAL PATHOLOGY

## 2022-10-14 ENCOUNTER — Encounter: Payer: Self-pay | Admitting: Physician Assistant

## 2022-10-14 ENCOUNTER — Ambulatory Visit (INDEPENDENT_AMBULATORY_CARE_PROVIDER_SITE_OTHER): Payer: 59 | Admitting: Physician Assistant

## 2022-10-14 VITALS — BP 112/75 | HR 73 | Temp 97.7°F | Ht 66.0 in | Wt 134.0 lb

## 2022-10-14 DIAGNOSIS — K810 Acute cholecystitis: Secondary | ICD-10-CM

## 2022-10-14 DIAGNOSIS — Z09 Encounter for follow-up examination after completed treatment for conditions other than malignant neoplasm: Secondary | ICD-10-CM

## 2022-10-14 NOTE — Progress Notes (Signed)
[facility] POST-OP OFFICE VISIT  10/14/2022  HPI: Ashley Ward is a 72 y.o. female 16 days s/p robotic assisted laparoscopic cholecystectomy for acute cholecystitis with Dr Everlene Farrier   She has done very well; no abdominal pain No fever, chills, nausea, emesis She has intermittent episodes of diarrhea; no correlation with PO intake Incisions are well healed Ambulating well  Vital signs: BP 112/75   Pulse 73   Temp 97.7 F (36.5 C) (Oral)   Ht 5\' 6"  (1.676 m)   Wt 134 lb (60.8 kg)   SpO2 95%   BMI 21.63 kg/m    Physical Exam: Constitutional: Well appearing female, NAD Abdomen: Soft, non-tender, non-distended, no rebound/guarding Skin: Laparoscopic incisions are healing well, no erythema or drainage   Assessment/Plan: This is a 72 y.o. female 16 days s/p robotic assisted laparoscopic cholecystectomy for acute cholecystitis with Dr Everlene Farrier    - Reviewed etiology of diarrhea after cholecystectomy; okay to use OTC anti-diarrheals as needed; limit fatty foods   - Pain control prn  - Reviewed wound care recommendation  - Reviewed lifting restrictions; 4 weeks total  - Reviewed surgical pathology; CCC  - She can follow up on as needed basis; She understands to call with questions/concerns  -- Lynden Oxford, PA-C [facility] 10/14/2022, 2:21 PM M-F: 7am - 4pm

## 2022-10-14 NOTE — Patient Instructions (Signed)

## 2022-11-12 ENCOUNTER — Ambulatory Visit: Payer: 59 | Admitting: Internal Medicine

## 2022-11-20 ENCOUNTER — Other Ambulatory Visit: Payer: Self-pay | Admitting: Internal Medicine

## 2022-11-20 DIAGNOSIS — Z1231 Encounter for screening mammogram for malignant neoplasm of breast: Secondary | ICD-10-CM

## 2022-12-23 ENCOUNTER — Ambulatory Visit: Payer: 59 | Admitting: Internal Medicine

## 2022-12-23 ENCOUNTER — Ambulatory Visit
Admission: RE | Admit: 2022-12-23 | Discharge: 2022-12-23 | Disposition: A | Discharge status: Home / Self Care | Payer: 59 | Source: Ambulatory Visit | Attending: Internal Medicine | Admitting: Internal Medicine

## 2022-12-23 ENCOUNTER — Encounter: Payer: Self-pay | Admitting: Internal Medicine

## 2022-12-23 VITALS — BP 110/64 | HR 79 | Temp 98.3°F | Ht 66.0 in | Wt 131.6 lb

## 2022-12-23 DIAGNOSIS — Z1231 Encounter for screening mammogram for malignant neoplasm of breast: Secondary | ICD-10-CM | POA: Diagnosis not present

## 2022-12-23 DIAGNOSIS — E559 Vitamin D deficiency, unspecified: Secondary | ICD-10-CM

## 2022-12-23 DIAGNOSIS — N951 Menopausal and female climacteric states: Secondary | ICD-10-CM

## 2022-12-23 DIAGNOSIS — E663 Overweight: Secondary | ICD-10-CM | POA: Diagnosis not present

## 2022-12-23 DIAGNOSIS — F5102 Adjustment insomnia: Secondary | ICD-10-CM

## 2022-12-23 DIAGNOSIS — R7303 Prediabetes: Secondary | ICD-10-CM

## 2022-12-23 DIAGNOSIS — Z79899 Other long term (current) drug therapy: Secondary | ICD-10-CM

## 2022-12-23 DIAGNOSIS — F339 Major depressive disorder, recurrent, unspecified: Secondary | ICD-10-CM

## 2022-12-23 MED ORDER — BUPROPION HCL ER (XL) 300 MG PO TB24: ORAL_TABLET | ORAL | 1 refills | Status: DC

## 2022-12-23 MED ORDER — ESTRADIOL-PROGESTERONE 1-100 MG PO CAPS: 1.0000 | ORAL_CAPSULE | Freq: Every day | ORAL | 5 refills | Status: DC

## 2022-12-23 MED ORDER — BUPROPION HCL ER (XL) 150 MG PO TB24: ORAL_TABLET | ORAL | 0 refills | Status: DC

## 2022-12-23 NOTE — Patient Instructions (Addendum)
We are starting estrogen /progesterone   HRT for MENOPAUSE SYMPTOMS.  REDUCE THE CLONAZEPAM  to 1/2 tablet nightly for one week,  then stop clonidine completely  Consider reducing your dose of wellbutin to 300 mg  after your menopause symptoms are better controlled

## 2022-12-23 NOTE — Progress Notes (Unsigned)
Subjective:  Patient ID: Ashley Ward, female    DOB: October 01, 1950  Age: 73 y.o. MRN: [id number]  CC: There were no encounter diagnoses.   HPI Ashley Ward presents for  Chief Complaint  Patient presents with   Medical Management of Chronic Issues   1) Major depressive disorder  taking wellbutrin  450 mg daily for years.     2) menopause with frequent hot flushes .  Has been using clonidine at 0.2 mg nightly.  Not terrible,  but disrupting her sleep. . No contraindications to HRT:   History of post menopausal spotting ,  was seen by gyn and endometrium biopsied ,  negative.   3) receiving  fluconazole once a week by dermatology for onychomycosis since Around  Aprill 15 .  Has not had LFTs since April admission for acute cholecystitis .  S.p cholexytetecomty  occaiosnla post prnadial diarrhea,  intermittent  ' 4) had an episode  of left foot weakness occurred after walking for 30 minutes in a different pair of walking shoes.   5) medication induced weight loss:    lost 55 lbs using semaglutide   no weight regain  since stopping  3 months ago.  Med was prescribed by a [place] doctor dr Aline August (biosymmetry ).    Planning to work with a Psychologist, educational at the club  6) TAKING 5000 iUS OF VITAM-N d DAILY PLUS A ONE A DAY MVI    Outpatient Medications Prior to Visit  Medication Sig Dispense Refill   buPROPion (WELLBUTRIN XL) 150 MG 24 hr tablet TAKE ONE TABLET EVERY DAY ALONG WITH 300MG  FOR DAILY DOSE OF 450MG  30 tablet 0   buPROPion (WELLBUTRIN XL) 300 MG 24 hr tablet TAKE ONE TABLET EVERY DAY ALONG WITH 150MG  FOR DAILY DOSE OF 450MG  . NEED APPT. 90 tablet 1   cholecalciferol (VITAMIN D3) 25 MCG (1000 UNIT) tablet Take 1,000 Units by mouth daily.     cloNIDine (CATAPRES) 0.2 MG tablet Take 1 tablet (0.2 mg total) by mouth daily. 90 tablet 3   fluconazole (DIFLUCAN) 200 MG tablet Take 200 mg by mouth once a week.     Multiple Vitamins-Minerals (PRESERVISION AREDS PO) Take 2 capsules by  mouth daily.     ergocalciferol (VITAMIN D2) 1.25 MG (50000 UT) capsule Take 1 capsule (50,000 Units total) by mouth once a week. (Patient not taking: Reported on 12/23/2022) 4 capsule 2   No facility-administered medications prior to visit.    Review of Systems;  Patient denies headache, fevers, malaise, unintentional weight loss, skin rash, eye pain, sinus congestion and sinus pain, sore throat, dysphagia,  hemoptysis , cough, dyspnea, wheezing, chest pain, palpitations, orthopnea, edema, abdominal pain, nausea, melena, diarrhea, constipation, flank pain, dysuria, hematuria, urinary  Frequency, nocturia, numbness, tingling, seizures,  Focal weakness, Loss of consciousness,  Tremor, insomnia, depression, anxiety, and suicidal ideation.      Objective:  BP 110/64   Pulse 79   Temp 98.3 F (36.8 C) (Oral)   Ht 5\' 6"  (1.676 m)   Wt 131 lb 9.6 oz (59.7 kg)   SpO2 97%   BMI 21.24 kg/m   BP Readings from Last 3 Encounters:  12/23/22 110/64  10/14/22 112/75  09/28/22 138/69    Wt Readings from Last 3 Encounters:  12/23/22 131 lb 9.6 oz (59.7 kg)  10/14/22 134 lb (60.8 kg)  09/27/22 135 lb (61.2 kg)    Physical Exam  Lab Results  Component Value Date   HGBA1C 6.2  05/02/2021   HGBA1C 5.6 05/02/2020   HGBA1C 5.6 05/04/2019    Lab Results  Component Value Date   CREATININE 0.79 09/27/2022   CREATININE 0.99 05/14/2022   CREATININE 0.86 05/02/2021    Lab Results  Component Value Date   WBC 9.1 09/27/2022   HGB 13.0 09/27/2022   HCT 38.8 09/27/2022   PLT 224 09/27/2022   GLUCOSE 134 (H) 09/27/2022   CHOL 202 (H) 05/14/2022   TRIG 113.0 05/14/2022   HDL 61.40 05/14/2022   LDLDIRECT 152.0 05/04/2019   LDLCALC 118 (H) 05/14/2022   ALT 57 (H) 09/27/2022   AST 128 (H) 09/27/2022   NA 138 09/27/2022   K 4.1 09/27/2022   CL 103 09/27/2022   CREATININE 0.79 09/27/2022   BUN 25 (H) 09/27/2022   CO2 26 09/27/2022   TSH 2.32 05/14/2022   HGBA1C 6.2 05/02/2021    MICROALBUR 1.0 05/02/2021    No results found.  Assessment & Plan:  .There are no diagnoses linked to this encounter.   I provided 30 minutes of face-to-face time during this encounter reviewing patient's last visit with me, patient's  most recent visit with cardiology,  nephrology,  and neurology,  recent surgical and non surgical procedures, previous  labs and imaging studies, counseling on currently addressed issues,  and post visit ordering to diagnostics and therapeutics .   Follow-up: No follow-ups on file.   Sherlene Shams, MD

## 2022-12-23 NOTE — Assessment & Plan Note (Signed)
Taking  clonidine at bedtime

## 2022-12-24 ENCOUNTER — Other Ambulatory Visit: Payer: Self-pay | Admitting: Internal Medicine

## 2022-12-24 DIAGNOSIS — R928 Other abnormal and inconclusive findings on diagnostic imaging of breast: Secondary | ICD-10-CM

## 2022-12-24 DIAGNOSIS — N6489 Other specified disorders of breast: Secondary | ICD-10-CM

## 2022-12-24 LAB — COMPREHENSIVE METABOLIC PANEL
ALT: 17 U/L (ref 0–35)
AST: 17 U/L (ref 0–37)
Albumin: 4.2 g/dL (ref 3.5–5.2)
Alkaline Phosphatase: 51 U/L (ref 39–117)
BUN: 19 mg/dL (ref 6–23)
CO2: 31 mEq/L (ref 19–32)
Calcium: 9.3 mg/dL (ref 8.4–10.5)
Chloride: 103 mEq/L (ref 96–112)
Creatinine, Ser: 0.91 mg/dL (ref 0.40–1.20)
GFR: 63.29 mL/min (ref >60.00)
Glucose, Bld: 94 mg/dL (ref 70–99)
Potassium: 4.1 mEq/L (ref 3.5–5.1)
Sodium: 141 mEq/L (ref 135–145)
Total Bilirubin: 0.4 mg/dL (ref 0.2–1.2)
Total Protein: 6.2 g/dL (ref 6.0–8.3)

## 2022-12-24 LAB — VITAMIN D 25 HYDROXY (VIT D DEFICIENCY, FRACTURES): VITD: 38.54 ng/mL (ref 30.00–100.00)

## 2022-12-24 LAB — TSH: TSH: 2.17 u[IU]/mL (ref 0.35–5.50)

## 2022-12-24 NOTE — Assessment & Plan Note (Signed)
Secondary to complicated grief following the loss of her grandchild over  6 years ago.. she has requested advice  in weaning off of medication,.  Advised her to reduce dose to 300 mg daily after her menopause symptoms are managed

## 2022-12-24 NOTE — Assessment & Plan Note (Addendum)
Managed  with trazodone , some contribution with clonidine , which is being stopped during trial of HRT .

## 2022-12-24 NOTE — Assessment & Plan Note (Signed)
I have congratulated her in the maintenance of the weight loss of 50  lbs using semiglutide and a low glycemic index diet prescribed by an MD in [employer] [place]) .  I have  encouraged her to begin exercising regularly .

## 2022-12-29 ENCOUNTER — Other Ambulatory Visit: Payer: Self-pay | Admitting: Internal Medicine

## 2022-12-29 ENCOUNTER — Ambulatory Visit
Admission: RE | Admit: 2022-12-29 | Discharge: 2022-12-29 | Disposition: A | Discharge status: Home / Self Care | Payer: 59 | Source: Ambulatory Visit | Attending: Internal Medicine | Admitting: Internal Medicine

## 2022-12-29 DIAGNOSIS — Z043 Encounter for examination and observation following other accident: Secondary | ICD-10-CM | POA: Diagnosis not present

## 2022-12-29 DIAGNOSIS — N6489 Other specified disorders of breast: Secondary | ICD-10-CM | POA: Insufficient documentation

## 2022-12-29 DIAGNOSIS — R92333 Mammographic heterogeneous density, bilateral breasts: Secondary | ICD-10-CM | POA: Diagnosis not present

## 2022-12-29 DIAGNOSIS — R928 Other abnormal and inconclusive findings on diagnostic imaging of breast: Secondary | ICD-10-CM | POA: Insufficient documentation

## 2023-01-29 ENCOUNTER — Other Ambulatory Visit: Payer: Self-pay | Admitting: Internal Medicine

## 2023-02-24 DIAGNOSIS — H2513 Age-related nuclear cataract, bilateral: Secondary | ICD-10-CM | POA: Diagnosis not present

## 2023-02-24 DIAGNOSIS — H538 Other visual disturbances: Secondary | ICD-10-CM | POA: Diagnosis not present

## 2023-02-27 DIAGNOSIS — H2513 Age-related nuclear cataract, bilateral: Secondary | ICD-10-CM | POA: Diagnosis not present

## 2023-02-27 DIAGNOSIS — H43813 Vitreous degeneration, bilateral: Secondary | ICD-10-CM | POA: Diagnosis not present

## 2023-02-27 DIAGNOSIS — H04123 Dry eye syndrome of bilateral lacrimal glands: Secondary | ICD-10-CM | POA: Diagnosis not present

## 2023-03-18 ENCOUNTER — Encounter: Payer: Self-pay | Admitting: Ophthalmology

## 2023-03-20 NOTE — Anesthesia Preprocedure Evaluation (Signed)
Anesthesia Evaluation   Patient awake    Reviewed: Allergy & Precautions, H&P , NPO status , Patient's Chart, lab work & pertinent test results  Airway Mallampati: III  TM Distance: >3 FB Neck ROM: Full    Dental no notable dental hx.  Has posterior dental wire on upper and lower teeth, "for stability" of teeth:   Pulmonary neg pulmonary ROS   Pulmonary exam normal breath sounds clear to auscultation       Cardiovascular negative cardio ROS Normal cardiovascular exam Rhythm:Regular Rate:Normal     Neuro/Psych  PSYCHIATRIC DISORDERS  Depression    negative neurological ROS     GI/Hepatic negative GI ROS, Neg liver ROS,,,  Endo/Other  negative endocrine ROS    Renal/GU negative Renal ROS  negative genitourinary   Musculoskeletal negative musculoskeletal ROS (+)    Abdominal   Peds negative pediatric ROS (+)  Hematology negative hematology ROS (+)   Anesthesia Other Findings Wears contact lens   Reproductive/Obstetrics negative OB ROS                             Anesthesia Physical Anesthesia Plan  ASA: 2  Anesthesia Plan: MAC   Post-op Pain Management:    Induction: Intravenous  PONV Risk Score and Plan:   Airway Management Planned: Natural Airway and Nasal Cannula  Additional Equipment:   Intra-op Plan:   Post-operative Plan:   Informed Consent: I have reviewed the patients History and Physical, chart, labs and discussed the procedure including the risks, benefits and alternatives for the proposed anesthesia with the patient or authorized representative who has indicated his/her understanding and acceptance.     Dental Advisory Given  Plan Discussed with: Anesthesiologist, CRNA and Surgeon  Anesthesia Plan Comments: (Patient consented for risks of anesthesia including but not limited to:  - adverse reactions to medications - damage to eyes, teeth, lips or other oral  mucosa - nerve damage due to positioning  - sore throat or hoarseness - Damage to heart, brain, nerves, lungs, other parts of body or loss of life  Patient voiced understanding.)       Anesthesia Quick Evaluation

## 2023-03-23 NOTE — Discharge Instructions (Signed)

## 2023-03-25 ENCOUNTER — Ambulatory Visit: Payer: 59 | Admitting: Anesthesiology

## 2023-03-25 ENCOUNTER — Ambulatory Visit
Admission: RE | Admit: 2023-03-25 | Discharge: 2023-03-25 | Disposition: A | Discharge status: Home / Self Care | Payer: 59 | Attending: Ophthalmology | Admitting: Ophthalmology

## 2023-03-25 ENCOUNTER — Encounter: Payer: Self-pay | Admitting: Ophthalmology

## 2023-03-25 ENCOUNTER — Encounter
Admission: RE | Disposition: A | Discharge status: Home / Self Care | Payer: Self-pay | Source: Home / Self Care | Attending: Ophthalmology

## 2023-03-25 ENCOUNTER — Other Ambulatory Visit: Payer: Self-pay

## 2023-03-25 DIAGNOSIS — H2512 Age-related nuclear cataract, left eye: Secondary | ICD-10-CM | POA: Diagnosis not present

## 2023-03-25 DIAGNOSIS — F32A Depression, unspecified: Secondary | ICD-10-CM | POA: Diagnosis not present

## 2023-03-25 HISTORY — PX: CATARACT EXTRACTION W/PHACO: SHX586

## 2023-03-25 HISTORY — DX: Presence of spectacles and contact lenses: Z97.3

## 2023-03-25 SURGERY — PHACOEMULSIFICATION, CATARACT, WITH IOL INSERTION
Anesthesia: Monitor Anesthesia Care | Site: Eye | Laterality: Left

## 2023-03-25 MED ORDER — MIDAZOLAM HCL 2 MG/2ML IJ SOLN
INTRAMUSCULAR | Status: DC | PRN
  Administered 2023-03-25 (×2): 1 mg via INTRAVENOUS

## 2023-03-25 MED ORDER — FENTANYL CITRATE (PF) 100 MCG/2ML IJ SOLN
INTRAMUSCULAR | Status: AC
  Filled 2023-03-25: qty 2

## 2023-03-25 MED ORDER — TETRACAINE HCL 0.5 % OP SOLN
1.0000 [drp] | OPHTHALMIC | Status: AC
  Administered 2023-03-25 (×3): 1 [drp] via OPHTHALMIC

## 2023-03-25 MED ORDER — SIGHTPATH DOSE#1 BSS IO SOLN
INTRAOCULAR | Status: DC | PRN
  Administered 2023-03-25: 1 mL via INTRAMUSCULAR

## 2023-03-25 MED ORDER — SIGHTPATH DOSE#1 BSS IO SOLN
INTRAOCULAR | Status: DC | PRN
  Administered 2023-03-25: 15 mL

## 2023-03-25 MED ORDER — SODIUM CHLORIDE 0.9% FLUSH: 10.0000 mL | Freq: Once | INTRAVENOUS | Status: DC

## 2023-03-25 MED ORDER — TETRACAINE HCL 0.5 % OP SOLN
OPHTHALMIC | Status: AC
  Filled 2023-03-25: qty 4

## 2023-03-25 MED ORDER — LACTATED RINGERS IV SOLN: INTRAVENOUS | Status: DC

## 2023-03-25 MED ORDER — SIGHTPATH DOSE#1 BSS IO SOLN
INTRAOCULAR | Status: DC | PRN
  Administered 2023-03-25: 59 mL via OPHTHALMIC

## 2023-03-25 MED ORDER — BRIMONIDINE TARTRATE-TIMOLOL 0.2-0.5 % OP SOLN
OPHTHALMIC | Status: DC | PRN
  Administered 2023-03-25: 1 [drp] via OPHTHALMIC

## 2023-03-25 MED ORDER — MIDAZOLAM HCL 2 MG/2ML IJ SOLN
INTRAMUSCULAR | Status: AC
  Filled 2023-03-25: qty 2

## 2023-03-25 MED ORDER — SIGHTPATH DOSE#1 NA HYALUR & NA CHOND-NA HYALUR IO KIT
PACK | INTRAOCULAR | Status: DC | PRN
  Administered 2023-03-25: 1 via OPHTHALMIC

## 2023-03-25 MED ORDER — MOXIFLOXACIN HCL 0.5 % OP SOLN
OPHTHALMIC | Status: DC | PRN
  Administered 2023-03-25: .2 mL via OPHTHALMIC

## 2023-03-25 MED ORDER — FENTANYL CITRATE (PF) 100 MCG/2ML IJ SOLN
INTRAMUSCULAR | Status: DC | PRN
  Administered 2023-03-25 (×2): 50 ug via INTRAVENOUS

## 2023-03-25 MED ORDER — ARMC OPHTHALMIC DILATING DROPS
OPHTHALMIC | Status: AC
  Filled 2023-03-25: qty 0.5

## 2023-03-25 MED ORDER — ARMC OPHTHALMIC DILATING DROPS
1.0000 | OPHTHALMIC | Status: AC
  Administered 2023-03-25 (×3): 1 via OPHTHALMIC

## 2023-03-25 SURGICAL SUPPLY — 10 items
CATARACT SUITE SIGHTPATH (MISCELLANEOUS) ×1
FEE CATARACT SUITE SIGHTPATH (MISCELLANEOUS) ×1 IMPLANT
GLOVE SRG 8 PF TXTR STRL LF DI (GLOVE) ×1 IMPLANT
GLOVE SURG ENC TEXT LTX SZ7.5 (GLOVE) ×1 IMPLANT
GLOVE SURG UNDER POLY LF SZ8 (GLOVE) ×1
LENS CLAREON VIVITY IOL 23.0 ×1 IMPLANT
LENS IOL CLRN VT YLW 23.0 IMPLANT
NDL FILTER BLUNT 18X1 1/2 (NEEDLE) ×1 IMPLANT
NEEDLE FILTER BLUNT 18X1 1/2 (NEEDLE) ×1
SYR 3ML LL SCALE MARK (SYRINGE) ×1 IMPLANT

## 2023-03-25 NOTE — Op Note (Signed)
OPERATIVE NOTE  Ashley Ward [id number] 03/25/2023   PREOPERATIVE DIAGNOSIS:  Nuclear sclerotic cataract left eye. H25.12   POSTOPERATIVE DIAGNOSIS:    Nuclear sclerotic cataract left eye.     PROCEDURE:  Phacoemusification with posterior chamber intraocular lens placement of the left eye  Ultrasound time: Procedure(s): CATARACT EXTRACTION PHACO AND INTRAOCULAR LENS PLACEMENT (IOC) LEFT  CLAREON VIVITY  4.33  00:55.4 (Left)  LENS:   Implant Name Type Inv. Item Serial No. Manufacturer Lot No. LRB No. Used Action  LENS CLAREON VIVITY IOL 23.0 - [id number]  LENS CLAREON VIVITY IOL 23.0 [id number] [employer]  Left 1 Implanted     [id number]  SURGEON:  Deirdre Evener, MD   ANESTHESIA:  Topical with tetracaine drops and 2% Xylocaine jelly, augmented with 1% preservative-free intracameral lidocaine.    COMPLICATIONS:  None.   DESCRIPTION OF PROCEDURE:  The patient was identified in the holding room and transported to the operating room and placed in the supine position under the operating microscope.  The left eye was identified as the operative eye and it was prepped and draped in the usual sterile ophthalmic fashion.   A 1 millimeter clear-corneal paracentesis was made at the 1:30 position.  0.5 ml of preservative-free 1% lidocaine was injected into the anterior chamber.  The anterior chamber was filled with Viscoat viscoelastic.  A 2.4 millimeter keratome was used to make a near-clear corneal incision at the 10:30 position.  .  A curvilinear capsulorrhexis was made with a cystotome and capsulorrhexis forceps.  Balanced salt solution was used to hydrodissect and hydrodelineate the nucleus.   Phacoemulsification was then used in stop and chop fashion to remove the lens nucleus and epinucleus.  The remaining cortex was then removed using the irrigation and aspiration handpiece. Provisc was then placed into the capsular bag to distend it for lens placement.  A lens was then injected  into the capsular bag.  The remaining viscoelastic was aspirated.   Wounds were hydrated with balanced salt solution.  The anterior chamber was inflated to a physiologic pressure with balanced salt solution.  No wound leaks were noted. Vigamox 0.2 ml of a 1mg  per ml solution was injected into the anterior chamber for a dose of 0.2 mg of intracameral antibiotic at the completion of the case.   Timolol and Brimonidine drops were applied to the eye.  The patient was taken to the recovery room in stable condition without complications of anesthesia or surgery.  Ashley Ward 03/25/2023, 10:40 AM

## 2023-03-25 NOTE — Transfer of Care (Signed)
Immediate Anesthesia Transfer of Care Note  Patient: Ashley Ward  Procedure(s) Performed: CATARACT EXTRACTION PHACO AND INTRAOCULAR LENS PLACEMENT (IOC) LEFT  CLAREON VIVITY  4.33  00:55.4 (Left: Eye)  Patient Location: PACU  Anesthesia Type: MAC  Level of Consciousness: awake, alert  and patient cooperative  Airway and Oxygen Therapy: Patient Spontanous Breathing and Patient connected to supplemental oxygen  Post-op Assessment: Post-op Vital signs reviewed, Patient's Cardiovascular Status Stable, Respiratory Function Stable, Patent Airway and No signs of Nausea or vomiting  Post-op Vital Signs: Reviewed and stable  Complications: No notable events documented.

## 2023-03-25 NOTE — H&P (Signed)
[facility]   Primary Care Physician:  Sherlene Shams, MD Ophthalmologist: Dr. Lockie Mola  Pre-Procedure History & Physical: HPI:  Ashley Ward is a 72 y.o. female here for ophthalmic surgery.   Past Medical History:  Diagnosis Date   Hyperlipidemia    Menopause    Menopause    Radial head fracture, closed 11/09/2019   Wears contact lenses     Past Surgical History:  Procedure Laterality Date   DILATION AND CURETTAGE, DIAGNOSTIC / THERAPEUTIC  2007   Kincius,  for spotting, fresolved    Prior to Admission medications   Medication Sig Start Date End Date Taking? Authorizing Provider  buPROPion (WELLBUTRIN XL) 150 MG 24 hr tablet TAKE ONE TABLET EVERY DAY ALONG WITH 300MG  FOR A DAILY DOSE OF 450MG  01/29/23  Yes Sherlene Shams, MD  buPROPion (WELLBUTRIN XL) 300 MG 24 hr tablet TAKE ONE TABLET EVERY DAY ALONG WITH 150MG  FOR DAILY DOSE OF 450MG  . NEED APPT. 12/23/22  Yes Sherlene Shams, MD  cholecalciferol (VITAMIN D3) 25 MCG (1000 UNIT) tablet Take 1,000 Units by mouth daily.   Yes [provider]  Estradiol-Progesterone 1-100 MG CAPS Take 1 capsule by mouth daily. 12/23/22  Yes Sherlene Shams, MD  fluconazole (DIFLUCAN) 200 MG tablet Take 200 mg by mouth once a week. 09/25/22  Yes [provider]  Multiple Vitamins-Minerals (PRESERVISION AREDS PO) Take 2 capsules by mouth daily.   Yes [provider]  cloNIDine (CATAPRES) 0.2 MG tablet Take 1 tablet (0.2 mg total) by mouth daily. Patient not taking: Reported on 03/18/2023 12/24/21   Sherlene Shams, MD    Allergies as of 03/04/2023 - Review Complete 12/23/2022  Allergen Reaction Noted   Penicillins  07/08/2011    Family History  Problem Relation Age of Onset   Diabetes Mother    Cancer Mother        lymphoma., in remission   COPD Father    Cancer Sister        half sister , BRCA, negative genetic testing   Alcohol abuse Sister    Breast cancer Sister 56   Cancer Maternal  Grandfather        prostate   Hyperlipidemia Cousin    Breast cancer Cousin     Social History   Socioeconomic History   Marital status: Married    Spouse name: Not on file   Number of children: Not on file   Years of education: Not on file   Highest education level: Not on file  Occupational History   Not on file  Tobacco Use   Smoking status: Never   Smokeless tobacco: Never  Vaping Use   Vaping status: Never Used  Substance and Sexual Activity   Alcohol use: Yes    Alcohol/week: 1.0 standard drink of alcohol    Types: 1 Glasses of wine per week   Drug use: No   Sexual activity: Yes  Other Topics Concern   Not on file  Social History Narrative   Not on file   Social Determinants of Health   Financial Resource Strain: Low Risk  (03/12/2021)   Overall Financial Resource Strain (CARDIA)    Difficulty of Paying Living Expenses: Not hard at all  Food Insecurity: No Food Insecurity (09/28/2022)   Hunger Vital Sign    Worried About Running Out of Food in the Last Year: Never true    Ran Out of Food in the Last Year: Never true  Transportation  Needs: No Transportation Needs (09/28/2022)   PRAPARE - Administrator, Civil Service (Medical): No    Lack of Transportation (Non-Medical): No  Physical Activity: Insufficiently Active (03/12/2021)   Exercise Vital Sign    Days of Exercise per Week: 2 days    Minutes of Exercise per Session: 60 min  Stress: No Stress Concern Present (03/12/2021)   [name]    Feeling of Stress : Not at all  Social Connections: Unknown (03/12/2021)   Social Connection and Isolation Panel [NHANES]    Frequency of Communication with Friends and Family: More than three times a week    Frequency of Social Gatherings with Friends and Family: More than three times a week    Attends Religious Services: Not on file    Active Member of Clubs or Organizations: Not on file     Attends Banker Meetings: Not on file    Marital Status: Not on file  Intimate Partner Violence: Not At Risk (03/12/2021)   Humiliation, Afraid, Rape, and Kick questionnaire    Fear of Current or Ex-Partner: No    Emotionally Abused: No    Physically Abused: No    Sexually Abused: No    Review of Systems: See HPI, otherwise negative ROS  Physical Exam: BP 111/67   Pulse 84   Temp (!) 97.5 F (36.4 C) (Temporal)   Resp 12   Ht 5' 5.98" (1.676 m)   Wt 59.2 kg   SpO2 99%   BMI 21.07 kg/m  General:   Alert,  pleasant and cooperative in NAD Head:  Normocephalic and atraumatic. Lungs:  Clear to auscultation.    Heart:  Regular rate and rhythm.   Impression/Plan: Ashley Ward is here for ophthalmic surgery.  Risks, benefits, limitations, and alternatives regarding ophthalmic surgery have been reviewed with the patient.  Questions have been answered.  All parties agreeable.   Lockie Mola, MD  03/25/2023, 9:19 AM

## 2023-03-25 NOTE — Anesthesia Postprocedure Evaluation (Signed)
Anesthesia Post Note  Patient: Ashley Ward  Procedure(s) Performed: CATARACT EXTRACTION PHACO AND INTRAOCULAR LENS PLACEMENT (IOC) LEFT  CLAREON VIVITY  4.33  00:55.4 (Left: Eye)  Patient location during evaluation: PACU Anesthesia Type: MAC Level of consciousness: awake and alert Pain management: pain level controlled Vital Signs Assessment: post-procedure vital signs reviewed and stable Respiratory status: spontaneous breathing, nonlabored ventilation, respiratory function stable and patient connected to nasal cannula oxygen Cardiovascular status: stable and blood pressure returned to baseline Postop Assessment: no apparent nausea or vomiting Anesthetic complications: no   No notable events documented.   Last Vitals:  Vitals:   03/25/23 1045 03/25/23 1047  BP: 132/76 109/85  Pulse: 87 78  Resp: 14 14  Temp:  (!) 36.2 C  SpO2: 97% 97%    Last Pain:  Vitals:   03/25/23 1047  TempSrc:   PainSc: 0-No pain                 Jennalee Greaves C Kristiann Noyce

## 2023-03-26 DIAGNOSIS — H2511 Age-related nuclear cataract, right eye: Secondary | ICD-10-CM | POA: Diagnosis not present

## 2023-03-27 ENCOUNTER — Encounter: Payer: Self-pay | Admitting: Ophthalmology

## 2023-04-06 NOTE — Discharge Instructions (Signed)

## 2023-04-08 ENCOUNTER — Other Ambulatory Visit: Payer: Self-pay

## 2023-04-08 ENCOUNTER — Ambulatory Visit: Payer: 59 | Admitting: Anesthesiology

## 2023-04-08 ENCOUNTER — Ambulatory Visit
Admission: RE | Admit: 2023-04-08 | Discharge: 2023-04-08 | Disposition: A | Discharge status: Home / Self Care | Payer: 59 | Attending: Ophthalmology | Admitting: Ophthalmology

## 2023-04-08 ENCOUNTER — Encounter: Payer: Self-pay | Admitting: Ophthalmology

## 2023-04-08 ENCOUNTER — Encounter
Admission: RE | Disposition: A | Discharge status: Home / Self Care | Payer: Self-pay | Source: Home / Self Care | Attending: Ophthalmology

## 2023-04-08 DIAGNOSIS — Z961 Presence of intraocular lens: Secondary | ICD-10-CM | POA: Insufficient documentation

## 2023-04-08 DIAGNOSIS — Z833 Family history of diabetes mellitus: Secondary | ICD-10-CM | POA: Insufficient documentation

## 2023-04-08 DIAGNOSIS — E785 Hyperlipidemia, unspecified: Secondary | ICD-10-CM | POA: Insufficient documentation

## 2023-04-08 DIAGNOSIS — H2511 Age-related nuclear cataract, right eye: Secondary | ICD-10-CM | POA: Insufficient documentation

## 2023-04-08 DIAGNOSIS — Z79890 Hormone replacement therapy (postmenopausal): Secondary | ICD-10-CM | POA: Insufficient documentation

## 2023-04-08 DIAGNOSIS — Z79899 Other long term (current) drug therapy: Secondary | ICD-10-CM | POA: Insufficient documentation

## 2023-04-08 DIAGNOSIS — Z9842 Cataract extraction status, left eye: Secondary | ICD-10-CM | POA: Diagnosis not present

## 2023-04-08 DIAGNOSIS — H269 Unspecified cataract: Secondary | ICD-10-CM | POA: Diagnosis not present

## 2023-04-08 HISTORY — PX: CATARACT EXTRACTION W/PHACO: SHX586

## 2023-04-08 SURGERY — PHACOEMULSIFICATION, CATARACT, WITH IOL INSERTION
Anesthesia: Monitor Anesthesia Care | Site: Eye | Laterality: Right

## 2023-04-08 MED ORDER — ONDANSETRON HCL 4 MG/2ML IJ SOLN
INTRAMUSCULAR | Status: DC | PRN
  Administered 2023-04-08: 4 mg via INTRAVENOUS

## 2023-04-08 MED ORDER — ARMC OPHTHALMIC DILATING DROPS
OPHTHALMIC | Status: AC
  Filled 2023-04-08: qty 0.5

## 2023-04-08 MED ORDER — FENTANYL CITRATE (PF) 100 MCG/2ML IJ SOLN
INTRAMUSCULAR | Status: AC
  Filled 2023-04-08: qty 2

## 2023-04-08 MED ORDER — ACETAMINOPHEN 160 MG/5ML PO SOLN
325.0000 mg | ORAL | Status: DC | PRN
Start: 2023-04-08 — End: 2023-04-08

## 2023-04-08 MED ORDER — SIGHTPATH DOSE#1 BSS IO SOLN
INTRAOCULAR | Status: DC | PRN
  Administered 2023-04-08: 50 mL via OPHTHALMIC

## 2023-04-08 MED ORDER — ACETAMINOPHEN 325 MG PO TABS: 650.0000 mg | ORAL_TABLET | Freq: Once | ORAL | Status: DC | PRN

## 2023-04-08 MED ORDER — ARMC OPHTHALMIC DILATING DROPS
1.0000 | OPHTHALMIC | Status: AC
  Administered 2023-04-08 (×3): 1 via OPHTHALMIC

## 2023-04-08 MED ORDER — MOXIFLOXACIN HCL 0.5 % OP SOLN
OPHTHALMIC | Status: DC | PRN
  Administered 2023-04-08: .2 mL via OPHTHALMIC

## 2023-04-08 MED ORDER — SIGHTPATH DOSE#1 BSS IO SOLN
INTRAOCULAR | Status: DC | PRN
  Administered 2023-04-08: 2 mL

## 2023-04-08 MED ORDER — TETRACAINE HCL 0.5 % OP SOLN
OPHTHALMIC | Status: AC
  Filled 2023-04-08: qty 4

## 2023-04-08 MED ORDER — ONDANSETRON HCL 4 MG/2ML IJ SOLN: 4.0000 mg | Freq: Once | INTRAMUSCULAR | Status: DC | PRN

## 2023-04-08 MED ORDER — SIGHTPATH DOSE#1 NA HYALUR & NA CHOND-NA HYALUR IO KIT
PACK | INTRAOCULAR | Status: DC | PRN
  Administered 2023-04-08: 1 via OPHTHALMIC

## 2023-04-08 MED ORDER — SODIUM CHLORIDE 0.9% FLUSH
10.0000 mL | Freq: Two times a day (BID) | INTRAVENOUS | Status: DC
  Administered 2023-04-08: 10 mL via INTRAVENOUS

## 2023-04-08 MED ORDER — BRIMONIDINE TARTRATE-TIMOLOL 0.2-0.5 % OP SOLN
OPHTHALMIC | Status: DC | PRN
  Administered 2023-04-08: 1 [drp] via OPHTHALMIC

## 2023-04-08 MED ORDER — SIGHTPATH DOSE#1 BSS IO SOLN
INTRAOCULAR | Status: DC | PRN
  Administered 2023-04-08: 15 mL via INTRAOCULAR

## 2023-04-08 MED ORDER — MIDAZOLAM HCL 2 MG/2ML IJ SOLN
INTRAMUSCULAR | Status: AC
  Filled 2023-04-08: qty 2

## 2023-04-08 MED ORDER — FENTANYL CITRATE (PF) 100 MCG/2ML IJ SOLN
INTRAMUSCULAR | Status: DC | PRN
  Administered 2023-04-08 (×2): 50 ug via INTRAVENOUS

## 2023-04-08 MED ORDER — TETRACAINE HCL 0.5 % OP SOLN
1.0000 [drp] | OPHTHALMIC | Status: AC
  Administered 2023-04-08 (×3): 1 [drp] via OPHTHALMIC

## 2023-04-08 MED ORDER — MIDAZOLAM HCL 2 MG/2ML IJ SOLN
INTRAMUSCULAR | Status: DC | PRN
  Administered 2023-04-08 (×2): 1 mg via INTRAVENOUS

## 2023-04-08 SURGICAL SUPPLY — 12 items
CANNULA ANT/CHMB 27G (MISCELLANEOUS) IMPLANT
CANNULA ANT/CHMB 27GA (MISCELLANEOUS)
CATARACT SUITE SIGHTPATH (MISCELLANEOUS) ×1
FEE CATARACT SUITE SIGHTPATH (MISCELLANEOUS) ×1 IMPLANT
GLOVE SRG 8 PF TXTR STRL LF DI (GLOVE) ×1 IMPLANT
GLOVE SURG ENC TEXT LTX SZ7.5 (GLOVE) ×1 IMPLANT
GLOVE SURG UNDER POLY LF SZ8 (GLOVE) ×1
LENS CLAREON VIVITY TORIC 24.0 ×1 IMPLANT
LENS IOL CLRN VT TRC 3 24.0 IMPLANT
NDL FILTER BLUNT 18X1 1/2 (NEEDLE) ×1 IMPLANT
NEEDLE FILTER BLUNT 18X1 1/2 (NEEDLE) ×1
SYR 3ML LL SCALE MARK (SYRINGE) ×1 IMPLANT

## 2023-04-08 NOTE — Anesthesia Preprocedure Evaluation (Signed)
Anesthesia Evaluation  Patient identified by MRN, date of birth, ID band Patient awake    Reviewed: Allergy & Precautions, NPO status , Patient's Chart, lab work & pertinent test results  History of Anesthesia Complications Negative for: history of anesthetic complications  Airway Mallampati: I   Neck ROM: Full    Dental no notable dental hx.    Pulmonary neg pulmonary ROS   Pulmonary exam normal breath sounds clear to auscultation       Cardiovascular Exercise Tolerance: Good negative cardio ROS Normal cardiovascular exam Rhythm:Regular Rate:Normal     Neuro/Psych negative neurological ROS     GI/Hepatic negative GI ROS, Neg liver ROS,,,  Endo/Other  negative endocrine ROS    Renal/GU negative Renal ROS     Musculoskeletal   Abdominal   Peds  Hematology negative hematology ROS (+)   Anesthesia Other Findings   Reproductive/Obstetrics                             Anesthesia Physical Anesthesia Plan  ASA: 1  Anesthesia Plan: MAC   Post-op Pain Management:    Induction: Intravenous  PONV Risk Score and Plan: 2 and Treatment may vary due to age or medical condition, Midazolam and TIVA  Airway Management Planned: Natural Airway and Nasal Cannula  Additional Equipment:   Intra-op Plan:   Post-operative Plan:   Informed Consent: I have reviewed the patients History and Physical, chart, labs and discussed the procedure including the risks, benefits and alternatives for the proposed anesthesia with the patient or authorized representative who has indicated his/her understanding and acceptance.     Dental advisory given  Plan Discussed with: CRNA  Anesthesia Plan Comments: (LMA/GETA backup discussed.  Patient consented for risks of anesthesia including but not limited to:  - adverse reactions to medications - damage to eyes, teeth, lips or other oral mucosa - nerve damage  due to positioning  - sore throat or hoarseness - damage to heart, brain, nerves, lungs, other parts of body or loss of life  Informed patient about role of CRNA in peri- and intra-operative care.  Patient voiced understanding.)       Anesthesia Quick Evaluation

## 2023-04-08 NOTE — Anesthesia Postprocedure Evaluation (Signed)
Anesthesia Post Note  Patient: Ashley Ward  Procedure(s) Performed: CATARACT EXTRACTION PHACO AND INTRAOCULAR LENS PLACEMENT (IOC) RIGHT  CLAREON VIVITY TORIC LENS 4.64 00:31.9 (Right: Eye)  Patient location during evaluation: PACU Anesthesia Type: MAC Level of consciousness: awake and alert, oriented and patient cooperative Pain management: pain level controlled Vital Signs Assessment: post-procedure vital signs reviewed and stable Respiratory status: spontaneous breathing, nonlabored ventilation and respiratory function stable Cardiovascular status: blood pressure returned to baseline and stable Postop Assessment: adequate PO intake Anesthetic complications: no   No notable events documented.   Last Vitals:  Vitals:   04/08/23 1213 04/08/23 1218  BP: 111/77 114/69  Pulse: 79 71  Resp: 16 12  Temp: (!) 36.3 C 36.5 C  SpO2: 97% 95%    Last Pain:  Vitals:   04/08/23 1218  TempSrc:   PainSc: 0-No pain                 Reed Breech

## 2023-04-08 NOTE — Transfer of Care (Signed)
Immediate Anesthesia Transfer of Care Note  Patient: Ashley Ward  Procedure(s) Performed: CATARACT EXTRACTION PHACO AND INTRAOCULAR LENS PLACEMENT (IOC) RIGHT  CLAREON VIVITY TORIC LENS 4.64 00:31.9 (Right: Eye)  Patient Location: PACU  Anesthesia Type: MAC  Level of Consciousness: awake, alert  and patient cooperative  Airway and Oxygen Therapy: Patient Spontanous Breathing and Patient connected to supplemental oxygen  Post-op Assessment: Post-op Vital signs reviewed, Patient's Cardiovascular Status Stable, Respiratory Function Stable, Patent Airway and No signs of Nausea or vomiting  Post-op Vital Signs: Reviewed and stable  Complications: No notable events documented.

## 2023-04-08 NOTE — H&P (Signed)
[facility]   Primary Care Physician:  Sherlene Shams, MD Ophthalmologist: Dr. Lockie Mola  Pre-Procedure History & Physical: HPI:  Ashley Ward is a 72 y.o. female here for ophthalmic surgery.   Past Medical History:  Diagnosis Date   Hyperlipidemia    Menopause    Menopause    Radial head fracture, closed 11/09/2019   Wears contact lenses     Past Surgical History:  Procedure Laterality Date   CATARACT EXTRACTION W/PHACO Left 03/25/2023   Procedure: CATARACT EXTRACTION PHACO AND INTRAOCULAR LENS PLACEMENT (IOC) LEFT  CLAREON VIVITY  4.33  00:55.4;  Surgeon: Lockie Mola, MD;  Location: [facility];  Service: Ophthalmology;  Laterality: Left;   DILATION AND CURETTAGE, DIAGNOSTIC / THERAPEUTIC  2007   Kincius,  for spotting, fresolved    Prior to Admission medications   Medication Sig Start Date End Date Taking? Authorizing Provider  buPROPion (WELLBUTRIN XL) 150 MG 24 hr tablet TAKE ONE TABLET EVERY DAY ALONG WITH 300MG  FOR A DAILY DOSE OF 450MG  01/29/23  Yes Sherlene Shams, MD  buPROPion (WELLBUTRIN XL) 300 MG 24 hr tablet TAKE ONE TABLET EVERY DAY ALONG WITH 150MG  FOR DAILY DOSE OF 450MG  . NEED APPT. 12/23/22  Yes Sherlene Shams, MD  cholecalciferol (VITAMIN D3) 25 MCG (1000 UNIT) tablet Take 1,000 Units by mouth daily.   Yes [provider]  Estradiol-Progesterone 1-100 MG CAPS Take 1 capsule by mouth daily. 12/23/22  Yes Sherlene Shams, MD  fluconazole (DIFLUCAN) 200 MG tablet Take 200 mg by mouth once a week. 09/25/22  Yes [provider]  cloNIDine (CATAPRES) 0.2 MG tablet Take 1 tablet (0.2 mg total) by mouth daily. 12/24/21   Sherlene Shams, MD  Multiple Vitamins-Minerals (PRESERVISION AREDS PO) Take 2 capsules by mouth daily. Patient not taking: Reported on 04/08/2023    [provider]    Allergies as of 03/04/2023 - Review Complete 12/23/2022  Allergen Reaction Noted   Penicillins  07/08/2011    Family  History  Problem Relation Age of Onset   Diabetes Mother    Cancer Mother        lymphoma., in remission   COPD Father    Cancer Sister        half sister , BRCA, negative genetic testing   Alcohol abuse Sister    Breast cancer Sister 82   Cancer Maternal Grandfather        prostate   Hyperlipidemia Cousin    Breast cancer Cousin     Social History   Socioeconomic History   Marital status: Married    Spouse name: Not on file   Number of children: Not on file   Years of education: Not on file   Highest education level: Not on file  Occupational History   Not on file  Tobacco Use   Smoking status: Never   Smokeless tobacco: Never  Vaping Use   Vaping status: Never Used  Substance and Sexual Activity   Alcohol use: Yes    Alcohol/week: 1.0 standard drink of alcohol    Types: 1 Glasses of wine per week   Drug use: No   Sexual activity: Yes  Other Topics Concern   Not on file  Social History Narrative   Not on file   Social Determinants of Health   Financial Resource Strain: Low Risk  (03/12/2021)   Overall Financial Resource Strain (CARDIA)    Difficulty of Paying Living Expenses: Not hard at all  Food Insecurity: No Food Insecurity (09/28/2022)   Hunger Vital Sign    Worried About Running Out of Food in the Last Year: Never true    Ran Out of Food in the Last Year: Never true  Transportation Needs: No Transportation Needs (09/28/2022)   PRAPARE - Administrator, Civil Service (Medical): No    Lack of Transportation (Non-Medical): No  Physical Activity: Insufficiently Active (03/12/2021)   Exercise Vital Sign    Days of Exercise per Week: 2 days    Minutes of Exercise per Session: 60 min  Stress: No Stress Concern Present (03/12/2021)   [name]    Feeling of Stress : Not at all  Social Connections: Unknown (03/12/2021)   Social Connection and Isolation Panel [NHANES]    Frequency of  Communication with Friends and Family: More than three times a week    Frequency of Social Gatherings with Friends and Family: More than three times a week    Attends Religious Services: Not on file    Active Member of Clubs or Organizations: Not on file    Attends Banker Meetings: Not on file    Marital Status: Not on file  Intimate Partner Violence: Not At Risk (03/12/2021)   Humiliation, Afraid, Rape, and Kick questionnaire    Fear of Current or Ex-Partner: No    Emotionally Abused: No    Physically Abused: No    Sexually Abused: No    Review of Systems: See HPI, otherwise negative ROS  Physical Exam: BP 103/67   Pulse 78   Temp 97.8 F (36.6 C) (Oral)   Resp 14   Ht 5\' 6"  (1.676 m)   Wt 59.5 kg   SpO2 97%   BMI 21.16 kg/m  General:   Alert,  pleasant and cooperative in NAD Head:  Normocephalic and atraumatic. Lungs:  Clear to auscultation.    Heart:  Regular rate and rhythm.   Impression/Plan: Ashley Ward is here for ophthalmic surgery.  Risks, benefits, limitations, and alternatives regarding ophthalmic surgery have been reviewed with the patient.  Questions have been answered.  All parties agreeable.   Lockie Mola, MD  04/08/2023, 11:30 AM

## 2023-04-08 NOTE — Op Note (Signed)
  LOCATION:  [facility]   PREOPERATIVE DIAGNOSIS:  Nuclear sclerotic cataract of the right eye.  H25.11   POSTOPERATIVE DIAGNOSIS:  Nuclear sclerotic cataract of the right eye.   PROCEDURE:  Phacoemulsification with Toric posterior chamber intraocular lens placement of the right eye.  Ultrasound time: Procedure(s): CATARACT EXTRACTION PHACO AND INTRAOCULAR LENS PLACEMENT (IOC) RIGHT  CLAREON VIVITY TORIC LENS 4.64 00:31.9 (Right)  LENS:   Implant Name Type Inv. Item Serial No. Manufacturer Lot No. LRB No. Used Action  LENS CLAREON VIVITY TORIC 24.0 - [id number]  LENS CLAREON VIVITY TORIC 24.0 [id number] [employer]  Right 1 Implanted     [id number] Vivity Toric intraocular lens with 1.5 diopters of cylindrical power with axis orientation at 75 degrees.   SURGEON:  Deirdre Evener, MD   ANESTHESIA: Topical with tetracaine drops and 2% Xylocaine jelly, augmented with 1% preservative-free intracameral lidocaine. .   COMPLICATIONS:  None.   DESCRIPTION OF PROCEDURE:  The patient was identified in the holding room and transported to the operating suite and placed in the supine position under the operating microscope.  The right eye was identified as the operative eye, and it was prepped and draped in the usual sterile ophthalmic fashion.    A clear-corneal paracentesis incision was made at the 12:00 position.  0.5 ml of preservative-free 1% lidocaine was injected into the anterior chamber. The anterior chamber was filled with Viscoat.  A 2.4 millimeter near clear corneal incision was then made at the 9:00 position.  A cystotome and capsulorrhexis forceps were then used to make a curvilinear capsulorrhexis.  Hydrodissection and hydrodelineation were then performed using balanced salt solution.   Phacoemulsification was then used in stop and chop fashion to remove the lens, nucleus and epinucleus.  The remaining cortex was aspirated using the irrigation and aspiration handpiece.   Provisc viscoelastic was then placed into the capsular bag to distend it for lens placement.  The Verion digital marker was used to align the implant at the intended axis.   A Toric lens was then injected into the capsular bag.  It was rotated clockwise until the axis marks on the lens were approximately 15 degrees in the counterclockwise direction to the intended alignment.  The viscoelastic was aspirated from the eye using the irrigation aspiration handpiece.  Then, a Koch spatula through the sideport incision was used to rotate the lens in a clockwise direction until the axis markings of the intraocular lens were lined up with the Verion alignment.  Balanced salt solution was then used to hydrate the wounds. Vigamox 0.2 ml of a 1mg  per ml solution was injected into the anterior chamber for a dose of 0.2 mg of intracameral antibiotic at the completion of the case.    The eye was noted to have a physiologic pressure and there was no wound leak noted.   Timolol and Brimonidine drops were applied to the eye.  The patient was taken to the recovery room in stable condition having had no complications of anesthesia or surgery.  Adalberto Metzgar 04/08/2023, 12:11 PM

## 2023-04-09 ENCOUNTER — Encounter: Payer: Self-pay | Admitting: Ophthalmology

## 2023-05-25 ENCOUNTER — Other Ambulatory Visit: Payer: Self-pay | Admitting: Internal Medicine

## 2023-06-23 ENCOUNTER — Encounter: Payer: Self-pay | Admitting: Internal Medicine

## 2023-06-26 MED ORDER — ESTRADIOL 1 MG PO TABS: 1.0000 mg | ORAL_TABLET | Freq: Every day | ORAL | 1 refills | Status: DC

## 2023-06-26 MED ORDER — PROGESTERONE MICRONIZED 100 MG PO CAPS: 100.0000 mg | ORAL_CAPSULE | Freq: Every day | ORAL | 1 refills | Status: DC

## 2023-07-23 DIAGNOSIS — D225 Melanocytic nevi of trunk: Secondary | ICD-10-CM | POA: Diagnosis not present

## 2023-07-23 DIAGNOSIS — B353 Tinea pedis: Secondary | ICD-10-CM | POA: Diagnosis not present

## 2023-07-23 DIAGNOSIS — D2262 Melanocytic nevi of left upper limb, including shoulder: Secondary | ICD-10-CM | POA: Diagnosis not present

## 2023-07-23 DIAGNOSIS — D2261 Melanocytic nevi of right upper limb, including shoulder: Secondary | ICD-10-CM | POA: Diagnosis not present

## 2023-07-23 DIAGNOSIS — L570 Actinic keratosis: Secondary | ICD-10-CM | POA: Diagnosis not present

## 2023-10-17 ENCOUNTER — Other Ambulatory Visit: Payer: Self-pay | Admitting: Internal Medicine

## 2023-11-06 ENCOUNTER — Other Ambulatory Visit: Payer: Self-pay | Admitting: Internal Medicine

## 2023-11-27 DIAGNOSIS — N390 Urinary tract infection, site not specified: Secondary | ICD-10-CM | POA: Diagnosis not present

## 2023-12-15 ENCOUNTER — Other Ambulatory Visit: Payer: Self-pay | Admitting: Internal Medicine

## 2023-12-15 DIAGNOSIS — Z1231 Encounter for screening mammogram for malignant neoplasm of breast: Secondary | ICD-10-CM

## 2023-12-30 DIAGNOSIS — K080 Exfoliation of teeth due to systemic causes: Secondary | ICD-10-CM | POA: Diagnosis not present

## 2024-01-07 ENCOUNTER — Encounter

## 2024-01-13 ENCOUNTER — Ambulatory Visit
Admission: RE | Admit: 2024-01-13 | Discharge: 2024-01-13 | Disposition: A | Discharge status: Home / Self Care | Source: Ambulatory Visit | Attending: Internal Medicine | Admitting: Internal Medicine

## 2024-01-13 DIAGNOSIS — Z1231 Encounter for screening mammogram for malignant neoplasm of breast: Secondary | ICD-10-CM | POA: Diagnosis not present

## 2024-02-05 ENCOUNTER — Ambulatory Visit (INDEPENDENT_AMBULATORY_CARE_PROVIDER_SITE_OTHER): Admitting: Internal Medicine

## 2024-02-05 ENCOUNTER — Encounter: Payer: Self-pay | Admitting: Internal Medicine

## 2024-02-05 VITALS — BP 122/80 | HR 82 | Temp 98.1°F | Ht 66.0 in | Wt 136.8 lb

## 2024-02-05 DIAGNOSIS — E785 Hyperlipidemia, unspecified: Secondary | ICD-10-CM | POA: Diagnosis not present

## 2024-02-05 DIAGNOSIS — F5102 Adjustment insomnia: Secondary | ICD-10-CM

## 2024-02-05 DIAGNOSIS — R7303 Prediabetes: Secondary | ICD-10-CM

## 2024-02-05 DIAGNOSIS — E663 Overweight: Secondary | ICD-10-CM

## 2024-02-05 DIAGNOSIS — N950 Postmenopausal bleeding: Secondary | ICD-10-CM

## 2024-02-05 DIAGNOSIS — Z79899 Other long term (current) drug therapy: Secondary | ICD-10-CM | POA: Diagnosis not present

## 2024-02-05 DIAGNOSIS — Z0000 Encounter for general adult medical examination without abnormal findings: Secondary | ICD-10-CM | POA: Diagnosis not present

## 2024-02-05 DIAGNOSIS — F339 Major depressive disorder, recurrent, unspecified: Secondary | ICD-10-CM

## 2024-02-05 DIAGNOSIS — Z780 Asymptomatic menopausal state: Secondary | ICD-10-CM

## 2024-02-05 MED ORDER — BUPROPION HCL ER (XL) 150 MG PO TB24: ORAL_TABLET | ORAL | 1 refills | Status: DC

## 2024-02-05 MED ORDER — AZITHROMYCIN 500 MG PO TABS: 500.0000 mg | ORAL_TABLET | Freq: Every day | ORAL | 0 refills | Status: AC

## 2024-02-05 MED ORDER — PREDNISONE 10 MG PO TABS: ORAL_TABLET | ORAL | 0 refills | Status: AC

## 2024-02-05 MED ORDER — ONDANSETRON 4 MG PO TBDP: 4.0000 mg | ORAL_TABLET | Freq: Three times a day (TID) | ORAL | 0 refills | Status: AC | PRN

## 2024-02-05 MED ORDER — PROGESTERONE MICRONIZED 100 MG PO CAPS: 100.0000 mg | ORAL_CAPSULE | Freq: Every day | ORAL | 1 refills | Status: AC

## 2024-02-05 NOTE — Patient Instructions (Addendum)
 For the insomnia:  May improve with lower dose of welbutrin,  BUT IF NOT:  You might want to try using Relaxium for insomnia  (as seen on TV commercials) . It is available through [employer] and contains all natural supplements:  Melatonin 5 mg  Chamomile 25 mg Passionflower extract 75 mg GABA 100 mg Ashwaganda extract 125 mg Magnesium citrate, glycinate, oxide (100 mg)  L tryptophan 500 mg Valerest (proprietary  ingredient ; probably valeria root extract)    2) YOU HAVE LOST A LOT OF MUSCLE MASS:    YOU NEED 60 GRAMS OF PROTEIN DAILY .AND NEED TO START BUILDING MUSCLE WITH LOW WEIGHTS   You might want to try a premixed protein drink called Premier Protein shake in the morning.  It is less $$$ and very low sugar.  And will give you 1/2 of your protein and calcium  needs   160 cal  30 g protein  1 g sugar 50% calcium  needs     I have sent  Prednisone  taper,  AZITHROMYCIN   and ZOfran  (for nausea )) to your pharmacy to take on your [place] cruise. Do NOT  Start the antibiotic  For a viral URI, which is what most infections passed on a plane are from.  Start the prednisone  and use afrin nasal spray for congestion.  Use Delsym for nighttime cough.  Only start the azithromycin   if you  are having fevers (100.4 or higher),   Sinus pain  or yukky  drainage,  Or facial pain ,  or ear pain.  Please take a probiotic ( Align, Floraque or Culturelle), the generic version of one of these over the counter medications, or eat yogurt daily for a minimum of 3 weeks to prevent a serious antibiotic associated diarrhea  Called clostridium dificile colitis.

## 2024-02-05 NOTE — Progress Notes (Deleted)
 Subjective:  Patient ID: Ashley Ward, female    DOB: August 22, 1950  Age: 73 y.o. MRN: [id number]  CC: The primary encounter diagnosis was Hyperlipidemia LDL goal <160. Diagnoses of Prediabetes, Long-term use of high-risk medication, Postmenopausal estrogen deficiency, Insomnia due to psychological stress, Major depressive disorder, recurrent episode with melancholic features (HCC), and Overweight (BMI 25.0-29.9) were also pertinent to this visit.   HPI Ashley Ward presents for  Chief Complaint  Patient presents with   Medical Management of Chronic Issues  ;  1) weight management  :  Ashley Ward has lost 50 lbs using semaglutide/tirzepatide prescribed and managed by a clinic called [facility] in [place]:;  she reached her goal of 130 lbs one year ago and stopped the medication.  She has had a . WT GAIN OF 6 LBS  and is considering restarting it.  Her BMI is 22 .  She is not exercising, only walking 3/week with her husband.  She stopped PLAYING PICKLE BALL after her FRACTURE, she has lost muscle mass based on measurements she brings with her from the weight loss clinic.   Ashley Ward  iThe cost of the medicatio nand the monthly weigh in,  labs and counseling is $350  [facility]   in [place] and [employer]   2) Osteopenia   taking 2000 U d3 daily   one calcium  supplement  in her MVI   3) DEPRESSION;  from complicated grief over loss of cousin 2 yrs ago and loss of grandchild .  She feels ready to start weaning off of wellbutrin  .     Outpatient Medications Prior to Visit  Medication Sig Dispense Refill   buPROPion  (WELLBUTRIN  XL) 300 MG 24 hr tablet TAKE ONE TABLET ONCE DAILY ALONG WITH 150MG  FOR DAILY DOSE OF 450MG  . NEED APPT. 30 tablet 0   cholecalciferol (VITAMIN D3) 25 MCG (1000 UNIT) tablet Take 1,000 Units by mouth daily.     estradiol  (ESTRACE ) 1 MG tablet TAKE ONE TABLET BY MOUTH ONCE DAILY 90 tablet 0   Multiple Vitamins-Minerals (PRESERVISION AREDS PO) Take 2 capsules by mouth daily. (Patient  not taking: Reported on 02/05/2024)     buPROPion  (WELLBUTRIN  XL) 150 MG 24 hr tablet TAKE ONE TABLET EVERY DAY ALONG WITH 300MG  FOR A DAILY DOSE OF 450MG  90 tablet 1   cloNIDine  (CATAPRES ) 0.2 MG tablet Take 1 tablet (0.2 mg total) by mouth daily. (Patient not taking: Reported on 02/05/2024) 90 tablet 3   fluconazole (DIFLUCAN) 200 MG tablet Take 200 mg by mouth once a week. (Patient not taking: Reported on 02/05/2024)     progesterone  (PROMETRIUM ) 100 MG capsule Take 1 capsule (100 mg total) by mouth daily. 90 capsule 1   No facility-administered medications prior to visit.    Review of Systems;  Patient denies headache, fevers, malaise, unintentional weight loss, skin rash, eye pain, sinus congestion and sinus pain, sore throat, dysphagia,  hemoptysis , cough, dyspnea, wheezing, chest pain, palpitations, orthopnea, edema, abdominal pain, nausea, melena, diarrhea, constipation, flank pain, dysuria, hematuria, urinary  Frequency, nocturia, numbness, tingling, seizures,  Focal weakness, Loss of consciousness,  Tremor, insomnia, depression, anxiety, and suicidal ideation.      Objective:  BP 122/80   Pulse 82   Temp 98.1 F (36.7 C)   Ht 5' 6 (1.676 m)   Wt 136 lb 12.8 oz (62.1 kg)   SpO2 97%   BMI 22.08 kg/m   BP Readings from Last 3 Encounters:  02/05/24 122/80  04/08/23 114/69  03/25/23 109/85    Wt Readings from Last 3 Encounters:  02/05/24 136 lb 12.8 oz (62.1 kg)  04/08/23 131 lb 1.6 oz (59.5 kg)  03/25/23 130 lb 8 oz (59.2 kg)    Physical Exam Vitals reviewed.  Constitutional:      General: She is not in acute distress.    Appearance: Normal appearance. She is normal weight. She is not ill-appearing, toxic-appearing or diaphoretic.  HENT:     Head: Normocephalic.  Eyes:     General: No scleral icterus.       Right eye: No discharge.        Left eye: No discharge.     Conjunctiva/sclera: Conjunctivae normal.  Cardiovascular:     Rate and Rhythm: Normal rate and  regular rhythm.     Heart sounds: Normal heart sounds.  Pulmonary:     Effort: Pulmonary effort is normal. No respiratory distress.     Breath sounds: Normal breath sounds.  Musculoskeletal:        General: Normal range of motion.  Skin:    General: Skin is warm and dry.  Neurological:     General: No focal deficit present.     Mental Status: She is alert and oriented to person, place, and time. Mental status is at baseline.  Psychiatric:        Mood and Affect: Mood normal.        Behavior: Behavior normal.        Thought Content: Thought content normal.        Judgment: Judgment normal.     Lab Results  Component Value Date   HGBA1C 6.2 05/02/2021   HGBA1C 5.6 05/02/2020   HGBA1C 5.6 05/04/2019    Lab Results  Component Value Date   CREATININE 0.91 12/23/2022   CREATININE 0.79 09/27/2022   CREATININE 0.99 05/14/2022    Lab Results  Component Value Date   WBC 9.1 09/27/2022   HGB 13.0 09/27/2022   HCT 38.8 09/27/2022   PLT 224 09/27/2022   GLUCOSE 94 12/23/2022   CHOL 202 (H) 05/14/2022   TRIG 113.0 05/14/2022   HDL 61.40 05/14/2022   LDLDIRECT 152.0 05/04/2019   LDLCALC 118 (H) 05/14/2022   ALT 17 12/23/2022   AST 17 12/23/2022   NA 141 12/23/2022   K 4.1 12/23/2022   CL 103 12/23/2022   CREATININE 0.91 12/23/2022   BUN 19 12/23/2022   CO2 31 12/23/2022   TSH 2.17 12/23/2022   HGBA1C 6.2 05/02/2021    MM 3D SCREENING MAMMOGRAM BILATERAL BREAST Result Date: 01/15/2024 CLINICAL DATA:  Screening. EXAM: DIGITAL SCREENING BILATERAL MAMMOGRAM WITH TOMOSYNTHESIS AND CAD TECHNIQUE: Bilateral screening digital craniocaudal and mediolateral oblique mammograms were obtained. Bilateral screening digital breast tomosynthesis was performed. The images were evaluated with computer-aided detection. COMPARISON:  Previous exam(s). ACR Breast Density Category d: The breasts are extremely dense, which lowers the sensitivity of mammography. FINDINGS: There are no findings  suspicious for malignancy. IMPRESSION: No mammographic evidence of malignancy. A result letter of this screening mammogram will be mailed directly to the patient. RECOMMENDATION: Screening mammogram in one year. (Code:SM-B-01Y) BI-RADS CATEGORY  1: Negative. Electronically Signed   By: Reyes Phi M.D.   On: 01/15/2024 14:12    Assessment & Plan:  .Hyperlipidemia LDL goal <160  Prediabetes -     Comprehensive metabolic panel with GFR  Long-term use of high-risk medication -     CBC with Differential/Platelet  Postmenopausal estrogen deficiency -  DG Bone Density; Future  Insomnia due to psychological stress Assessment & Plan: Managed  with trazodone  ,  no longer using clonidine     Major depressive disorder, recurrent episode with melancholic features Ashley Ward) Assessment & Plan: Improving .  Reducing wellbutrin  dose today to 300 mg    Overweight (BMI 25.0-29.9) Assessment & Plan: She has lost 30 lbs using LP 1 agonist prescribed  by weight loss specialist. Encouraged to start exercising as part of healthy  lifestyle    Other orders -     Progesterone ; Take 1 capsule (100 mg total) by mouth daily.  Dispense: 90 capsule; Refill: 1 -     predniSONE ; 6 tablets on Day 1 , then reduce by 1 tablet daily until gone  Dispense: 21 tablet; Refill: 0 -     Azithromycin ; Take 1 tablet (500 mg total) by mouth daily.  Dispense: 7 tablet; Refill: 0 -     Ondansetron ; Take 1 tablet (4 mg total) by mouth every 8 (eight) hours as needed for nausea or vomiting.  Dispense: 20 tablet; Refill: 0     I spent 34 minutes on the day of this face to face encounter reviewing patient's  most recent visit with cardiology,  nephrology,  and neurology,  prior relevant surgical and non surgical procedures, recent  labs and imaging studies, counseling on weight management,  reviewing the assessment and plan with patient, and post visit ordering and reviewing of  diagnostics and therapeutics with patient  .    Follow-up: No follow-ups on file.   Ashley LITTIE Kettering, MD

## 2024-02-07 NOTE — Assessment & Plan Note (Addendum)
 Managed  with trazodone  ,  no longer using clonidine 

## 2024-02-07 NOTE — Assessment & Plan Note (Signed)
 She has lost 30 lbs using LP 1 agonist prescribed  by weight loss specialist. Encouraged to start exercising as part of healthy  lifestyle

## 2024-02-07 NOTE — Assessment & Plan Note (Signed)
age appropriate education and counseling updated, referrals for preventative services and immunizations addressed, dietary and smoking counseling addressed, most recent labs reviewed.  I have personally reviewed and have noted:   1) the patient's medical and social history 2) The pt's use of alcohol, tobacco, and illicit drugs 3) The patient's current medications and supplements 4) Functional ability including ADL's, fall risk, home safety risk, hearing and visual impairment 5) Diet and physical activities 6) Evidence for depression or mood disorder 7) The patient's height, weight, and BMI have been recorded in the chartin sinus rhythm.     I have made referrals, and provided counseling and education based on review of the above  

## 2024-02-07 NOTE — Assessment & Plan Note (Signed)
 Improving .  Reducing wellbutrin  dose today to 300 mg

## 2024-02-07 NOTE — Assessment & Plan Note (Signed)
 She has resumed HRT

## 2024-02-07 NOTE — Progress Notes (Signed)
 Patient ID: Ashley Ward, female    DOB: 28-Mar-1951  Age: 73 y.o. MRN: [id number]  The patient is here for annual preventive examination and management of other chronic and acute problems.   The risk factors are reflected in the social history.   The roster of all physicians providing medical care to patient - is listed in the Snapshot section of the chart.   Activities of daily living:  The patient is 100% independent in all ADLs: dressing, toileting, feeding as well as independent mobility   Home safety : The patient has smoke detectors in the home. They wear seatbelts.  There are no unsecured firearms at home. There is no violence in the home.    There is no risks for hepatitis, STDs or HIV. There is no   history of blood transfusion. They have no travel history to infectious disease endemic areas of the world.   The patient has seen their dentist in the last six month. They have seen their eye doctor in the last year. The patinet  denies slight hearing difficulty with regard to whispered voices and some television programs.  They have deferred audiologic testing in the last year.  They do not  have excessive sun exposure. Discussed the need for sun protection: hats, long sleeves and use of sunscreen if there is significant sun exposure.    Diet: the importance of a healthy diet is discussed. They do have a healthy diet.   The benefits of regular aerobic exercise were discussed. The patient  exercises  3 to 5 days per week  for  60 minutes.    Depression screen: there are no signs or vegative symptoms of depression- irritability, change in appetite, anhedonia, sadness/tearfullness.   The following portions of the patient's history were reviewed and updated as appropriate: allergies, current medications, past family history, past medical history,  past surgical history, past social history  and problem list.   Visual acuity was not assessed per patient preference since the patient has  regular follow up with an  ophthalmologist. Hearing and body mass index were assessed and reviewed.    During the course of the visit the patient was educated and counseled about appropriate screening and preventive services including : fall prevention , diabetes screening, nutrition counseling, colorectal cancer screening, and recommended immunizations.    Chief Complaint:  1) weight management  :  Ashley Ward has lost 50 lbs using semaglutide/tirzepatide prescribed and managed by a clinic called [facility] in [place]:;  she reached her goal of 130 lbs one year ago and stopped the medication.  She has had a . WT GAIN OF 6 LBS  and is considering restarting it.  Her BMI is 22 .  She is not exercising, only walking 3/week with her husband.  She stopped PLAYING PICKLE BALL after her FRACTURE, she has lost muscle mass based on measurements she brings with her from the weight loss clinic.   Ashley Ward  iThe cost of the medicatio nand the monthly weigh in,  labs and counseling is $350  [facility]   in [place] and [employer]   2) Osteopenia   taking 2000 U d3 daily   one calcium  supplement  in her MVI   3) DEPRESSION;  from complicated grief over loss of cousin 2 yrs ago and loss of grandchild .  She feels ready to start weaning off of wellbutrin  .      Review of Symptoms  Patient denies headache, fevers, malaise, unintentional weight loss, skin rash,  eye pain, sinus congestion and sinus pain, sore throat, dysphagia,  hemoptysis , cough, dyspnea, wheezing, chest pain, palpitations, orthopnea, edema, abdominal pain, nausea, melena, diarrhea, constipation, flank pain, dysuria, hematuria, urinary  Frequency, nocturia, numbness, tingling, seizures,  Focal weakness, Loss of consciousness,  Tremor, insomnia, depression, anxiety, and suicidal ideation.    Physical Exam:  BP 122/80   Pulse 82   Temp 98.1 F (36.7 C)   Ht 5' 6 (1.676 m)   Wt 136 lb 12.8 oz (62.1 kg)   SpO2 97%   BMI 22.08 kg/m    Physical Exam Vitals  reviewed.  Constitutional:      General: She is not in acute distress.    Appearance: Normal appearance. She is normal weight. She is not ill-appearing, toxic-appearing or diaphoretic.  HENT:     Head: Normocephalic.  Eyes:     General: No scleral icterus.       Right eye: No discharge.        Left eye: No discharge.     Conjunctiva/sclera: Conjunctivae normal.  Cardiovascular:     Rate and Rhythm: Normal rate and regular rhythm.     Heart sounds: Normal heart sounds.  Pulmonary:     Effort: Pulmonary effort is normal. No respiratory distress.     Breath sounds: Normal breath sounds.  Musculoskeletal:        General: Normal range of motion.  Skin:    General: Skin is warm and dry.  Neurological:     General: No focal deficit present.     Mental Status: She is alert and oriented to person, place, and time. Mental status is at baseline.  Psychiatric:        Mood and Affect: Mood normal.        Behavior: Behavior normal.        Thought Content: Thought content normal.        Judgment: Judgment normal.     Assessment and Plan: Hyperlipidemia LDL goal <160  Prediabetes -     Comprehensive metabolic panel with GFR  Long-term use of high-risk medication -     CBC with Differential/Platelet  Postmenopausal estrogen deficiency -     DG Bone Density; Future  Insomnia due to psychological stress Assessment & Plan: Managed  with trazodone  ,  no longer using clonidine     Major depressive disorder, recurrent episode with melancholic features [facility]) Assessment & Plan: Improving .  Reducing wellbutrin  dose today to 300 mg    Overweight (BMI 25.0-29.9) Assessment & Plan: She has lost 30 lbs using LP 1 agonist prescribed  by weight loss specialist. Encouraged to start exercising as part of healthy  lifestyle    Abnormal vaginal bleeding in postmenopausal patient Assessment & Plan: She has resumed HRT   Encounter for preventive health examination Assessment & Plan: age  appropriate education and counseling updated, referrals for preventative services and immunizations addressed, dietary and smoking counseling addressed, most recent labs reviewed.  I have personally reviewed and have noted:   1) the patient's medical and social history 2) The pt's use of alcohol, tobacco, and illicit drugs 3) The patient's current medications and supplements 4) Functional ability including ADL's, fall risk, home safety risk, hearing and visual impairment 5) Diet and physical activities 6) Evidence for depression or mood disorder 7) The patient's height, weight, and BMI have been recorded in the chartin sinus rhythm.     I have made referrals, and provided counseling and education based on review of the  above    Other orders -     Progesterone ; Take 1 capsule (100 mg total) by mouth daily.  Dispense: 90 capsule; Refill: 1 -     predniSONE ; 6 tablets on Day 1 , then reduce by 1 tablet daily until gone  Dispense: 21 tablet; Refill: 0 -     Azithromycin ; Take 1 tablet (500 mg total) by mouth daily.  Dispense: 7 tablet; Refill: 0 -     Ondansetron ; Take 1 tablet (4 mg total) by mouth every 8 (eight) hours as needed for nausea or vomiting.  Dispense: 20 tablet; Refill: 0    No follow-ups on file.  Verneita LITTIE Kettering, MD

## 2024-02-16 ENCOUNTER — Other Ambulatory Visit: Payer: Self-pay | Admitting: Internal Medicine

## 2024-02-19 DIAGNOSIS — H04123 Dry eye syndrome of bilateral lacrimal glands: Secondary | ICD-10-CM | POA: Diagnosis not present

## 2024-02-19 DIAGNOSIS — H43813 Vitreous degeneration, bilateral: Secondary | ICD-10-CM | POA: Diagnosis not present

## 2024-02-19 DIAGNOSIS — Z961 Presence of intraocular lens: Secondary | ICD-10-CM | POA: Diagnosis not present

## 2024-03-23 ENCOUNTER — Other Ambulatory Visit: Payer: Self-pay | Admitting: Internal Medicine

## 2024-05-23 ENCOUNTER — Other Ambulatory Visit: Payer: Self-pay | Admitting: Internal Medicine

## 2024-06-03 DIAGNOSIS — D485 Neoplasm of uncertain behavior of skin: Secondary | ICD-10-CM | POA: Diagnosis not present

## 2024-07-18 ENCOUNTER — Other Ambulatory Visit: Payer: Self-pay | Admitting: Internal Medicine

## 2024-07-21 NOTE — Telephone Encounter (Signed)
 Detailed vm left asking pt to CB to schedule a follow up appt with PCP as LOV was 01/2024 with no follow ups scheduled

## 2024-08-18 ENCOUNTER — Ambulatory Visit: Admitting: Internal Medicine
